# Patient Record
Sex: Female | Born: 1975 | Race: Black or African American | Hispanic: No | Marital: Single | Smoking: Never smoker
Health system: Southern US, Community
[De-identification: ages and names within clinical notes are randomized; demographics above are authoritative.]

## PROBLEM LIST (undated history)

## (undated) DIAGNOSIS — IMO0002 Reserved for concepts with insufficient information to code with codable children: Principal | ICD-10-CM

## (undated) DIAGNOSIS — Z87898 Personal history of other specified conditions: Secondary | ICD-10-CM

## (undated) DIAGNOSIS — R51 Headache: Secondary | ICD-10-CM

## (undated) DIAGNOSIS — B977 Papillomavirus as the cause of diseases classified elsewhere: Secondary | ICD-10-CM

## (undated) DIAGNOSIS — Z973 Presence of spectacles and contact lenses: Secondary | ICD-10-CM

## (undated) DIAGNOSIS — K219 Gastro-esophageal reflux disease without esophagitis: Secondary | ICD-10-CM

## (undated) DIAGNOSIS — J302 Other seasonal allergic rhinitis: Secondary | ICD-10-CM

## (undated) DIAGNOSIS — Z862 Personal history of diseases of the blood and blood-forming organs and certain disorders involving the immune mechanism: Secondary | ICD-10-CM

## (undated) DIAGNOSIS — Z87410 Personal history of cervical dysplasia: Secondary | ICD-10-CM

## (undated) DIAGNOSIS — D649 Anemia, unspecified: Secondary | ICD-10-CM

## (undated) DIAGNOSIS — I10 Essential (primary) hypertension: Secondary | ICD-10-CM

## (undated) DIAGNOSIS — L0592 Pilonidal sinus without abscess: Secondary | ICD-10-CM

## (undated) HISTORY — DX: Other seasonal allergic rhinitis: J30.2

## (undated) HISTORY — DX: Essential (primary) hypertension: I10

## (undated) HISTORY — PX: WISDOM TOOTH EXTRACTION: SHX21

## (undated) HISTORY — DX: Reserved for concepts with insufficient information to code with codable children: IMO0002

## (undated) HISTORY — DX: Papillomavirus as the cause of diseases classified elsewhere: B97.7

---

## 1996-11-06 HISTORY — PX: DILATION AND CURETTAGE OF UTERUS: SHX78

## 2001-04-10 ENCOUNTER — Other Ambulatory Visit: Admission: RE | Admit: 2001-04-10 | Discharge: 2001-04-10 | Payer: Self-pay | Admitting: Gynecology

## 2001-05-08 ENCOUNTER — Inpatient Hospital Stay (HOSPITAL_COMMUNITY): Admission: AD | Admit: 2001-05-08 | Discharge: 2001-05-08 | Payer: Self-pay | Admitting: Gynecology

## 2002-02-23 ENCOUNTER — Emergency Department (HOSPITAL_COMMUNITY): Admission: EM | Admit: 2002-02-23 | Discharge: 2002-02-24 | Payer: Self-pay

## 2002-04-16 ENCOUNTER — Emergency Department (HOSPITAL_COMMUNITY): Admission: EM | Admit: 2002-04-16 | Discharge: 2002-04-16 | Payer: Self-pay | Admitting: *Deleted

## 2002-04-30 ENCOUNTER — Other Ambulatory Visit: Admission: RE | Admit: 2002-04-30 | Discharge: 2002-04-30 | Payer: Self-pay | Admitting: Gynecology

## 2004-11-24 ENCOUNTER — Emergency Department (HOSPITAL_COMMUNITY): Admission: EM | Admit: 2004-11-24 | Discharge: 2004-11-24 | Payer: Self-pay | Admitting: Emergency Medicine

## 2006-03-07 ENCOUNTER — Inpatient Hospital Stay (HOSPITAL_COMMUNITY): Admission: AD | Admit: 2006-03-07 | Discharge: 2006-03-08 | Payer: Self-pay | Admitting: *Deleted

## 2006-07-15 IMAGING — US US PELVIS COMPLETE
1 series · 14 of 25 positions shown · non-contrast
Comparison: None

CLINICAL DATA: Heavy bleeding, not pregnant

TRANSABDOMINAL AND TRANSVAGINAL PELVIC ULTRASOUND
TECHNIQUE: Both transabdominal and transvaginal ultrasound examinations of the
pelvis were performed including evaluation of the uterus, ovaries, adnexal
regions, and pelvic cul-de-sac.

[Series 1: us pelvis complete · 0.37mm/px · 14 of 46 slices shown]
[im 1/46]
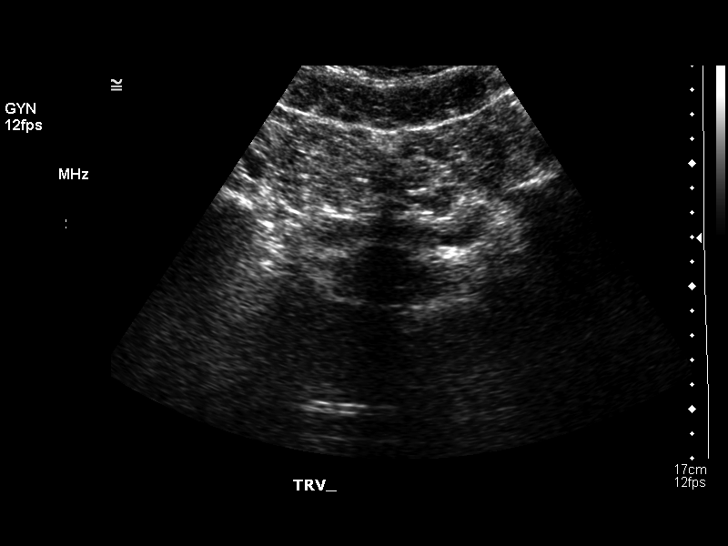
[im 4/46]
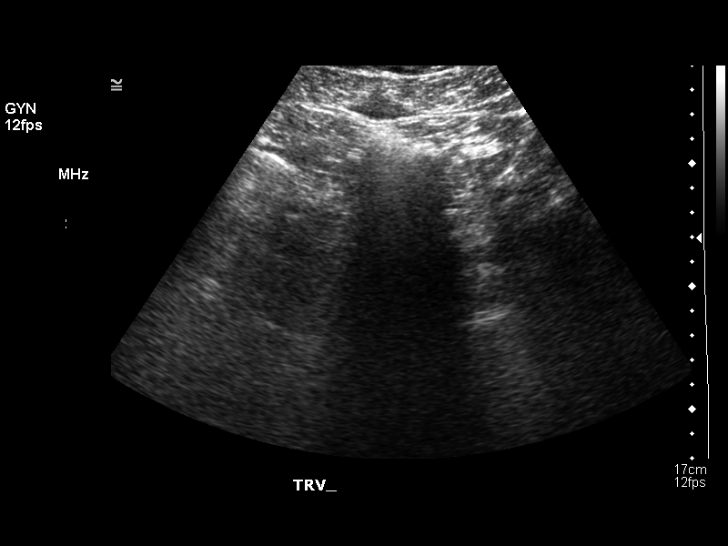
[im 8/46]
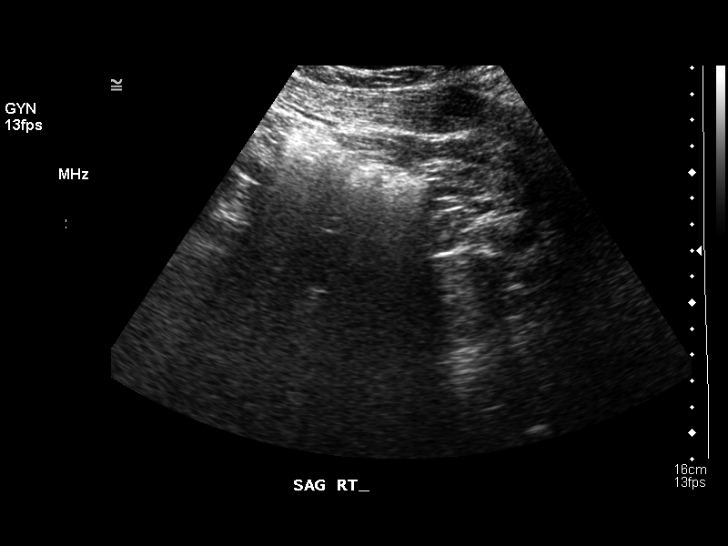
[im 12/46]
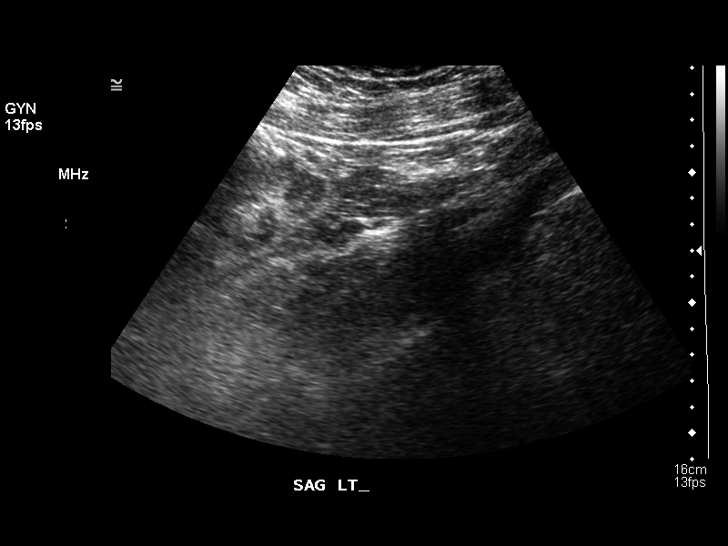
[im 16/46]
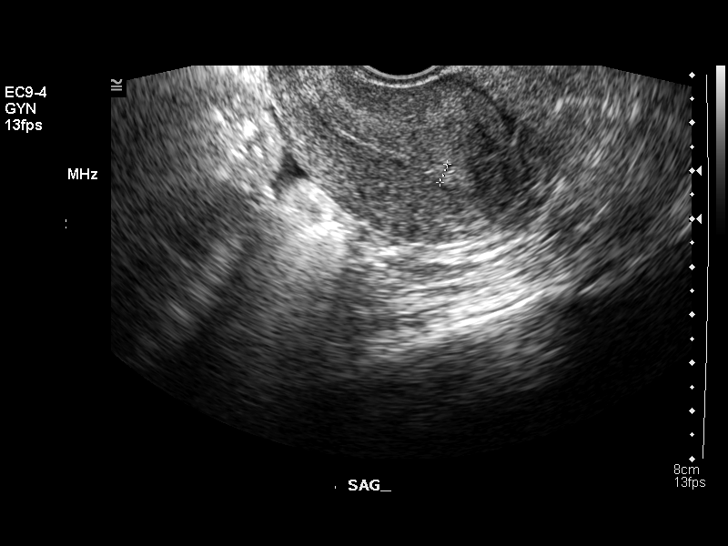
[im 17/46]
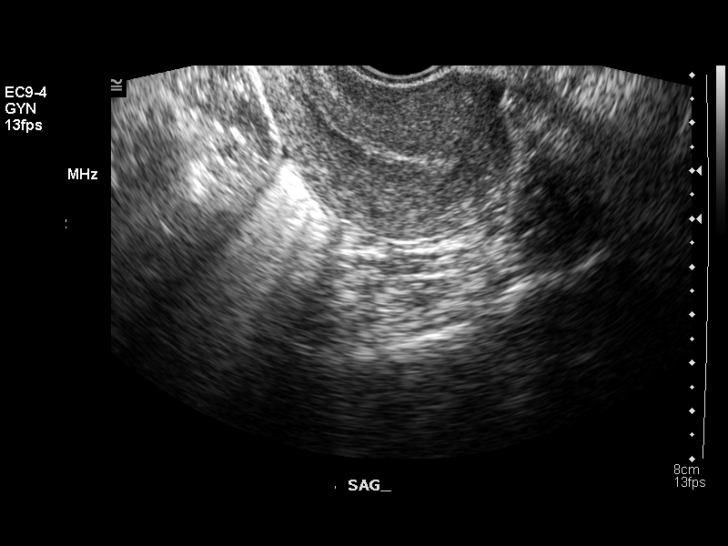
[im 21/46]
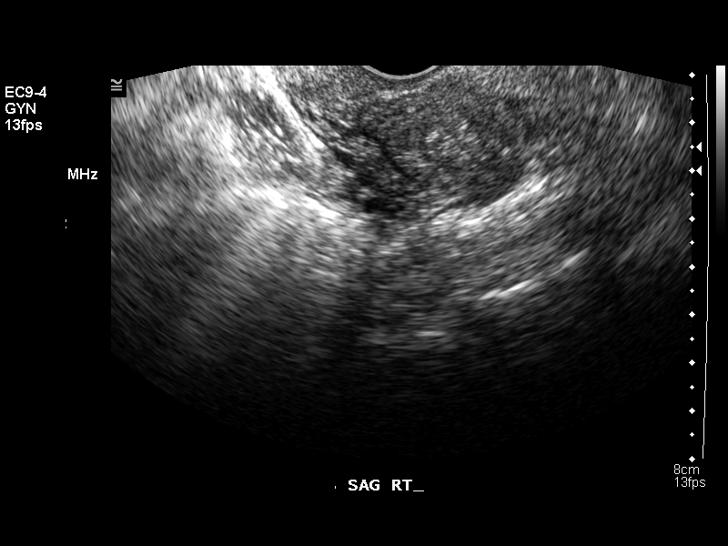
[im 25/46]
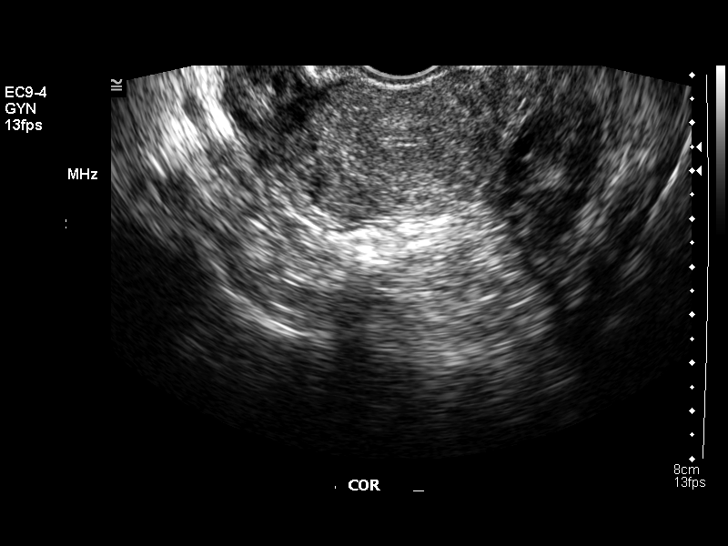
[im 29/46]
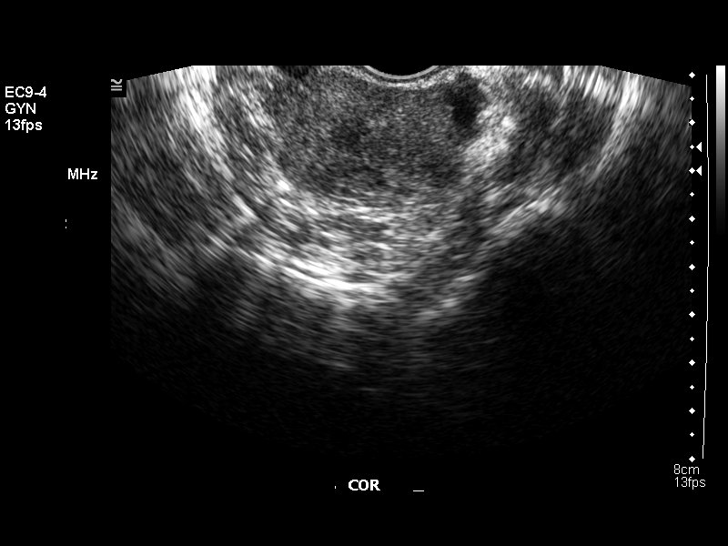
[im 31/46]
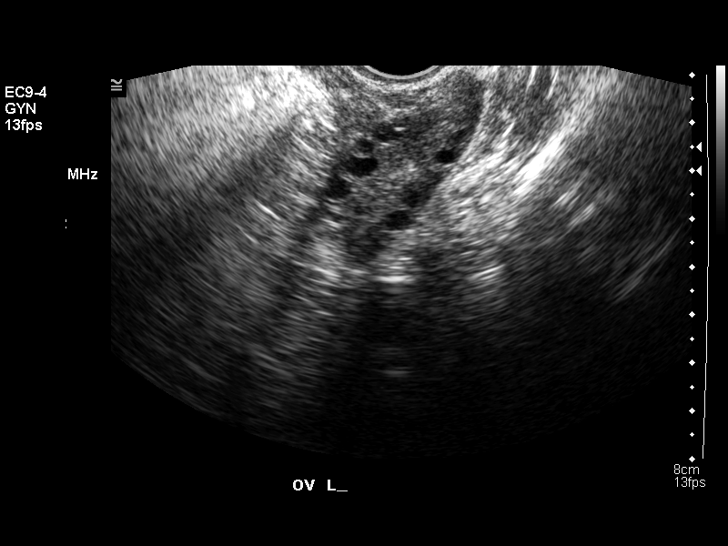
[im 34/46]
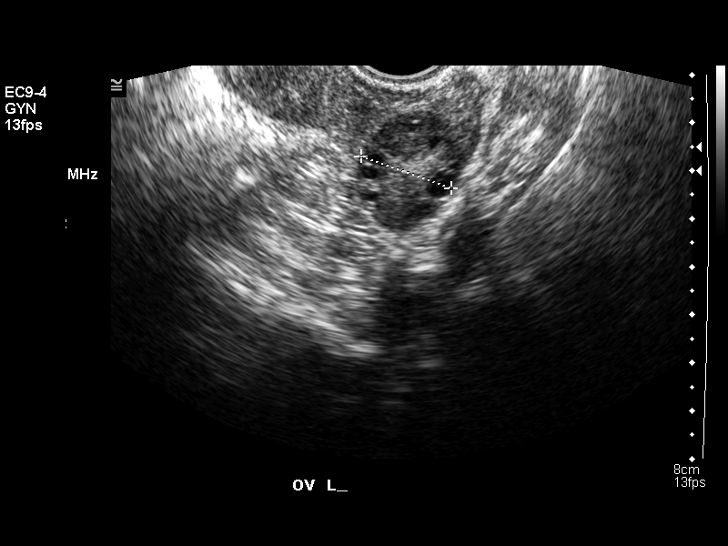
[im 38/46]
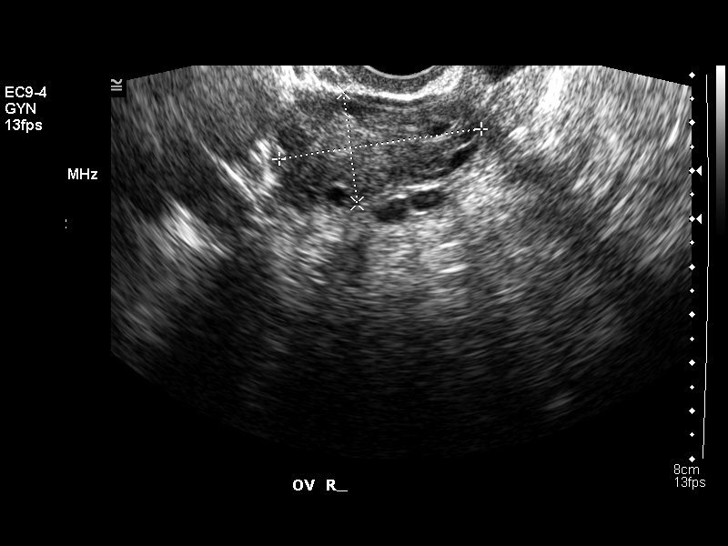
[im 42/46]
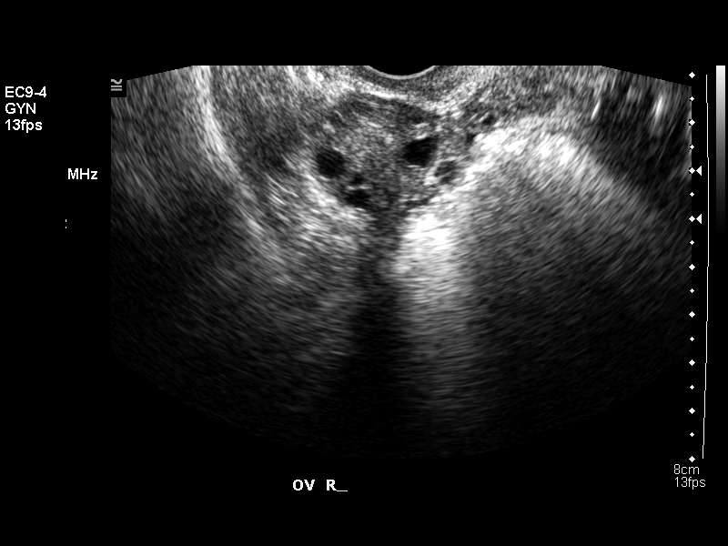
[im 46/46]
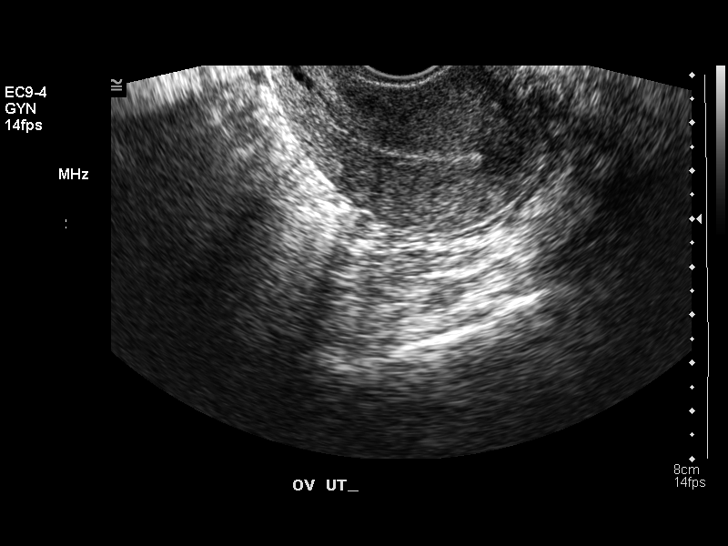

[14 of 25 positions shown; findings below may reference images not displayed]

FINDINGS: Uterus is 6.7 x 3.6 x 4.2 cm. Endometrial stripe is normal at 4 mm.
Small right paraovarian cyst at 1.2 cm. Both ovaries unremarkable. No free
fluid.

IMPRESSION

Small right paraovarian cyst. Otherwise unremarkable.

## 2008-10-13 ENCOUNTER — Emergency Department (HOSPITAL_COMMUNITY): Admission: EM | Admit: 2008-10-13 | Discharge: 2008-10-13 | Payer: Self-pay | Admitting: Emergency Medicine

## 2013-04-29 ENCOUNTER — Inpatient Hospital Stay (HOSPITAL_COMMUNITY)
Admission: AD | Admit: 2013-04-29 | Discharge: 2013-04-29 | Disposition: A | Discharge status: Home / Self Care | Payer: BC Managed Care – PPO | Source: Ambulatory Visit | Attending: Obstetrics & Gynecology | Admitting: Obstetrics & Gynecology

## 2013-04-29 ENCOUNTER — Encounter (HOSPITAL_COMMUNITY): Payer: Self-pay | Admitting: *Deleted

## 2013-04-29 ENCOUNTER — Inpatient Hospital Stay (HOSPITAL_COMMUNITY): Payer: BC Managed Care – PPO

## 2013-04-29 DIAGNOSIS — N83209 Unspecified ovarian cyst, unspecified side: Secondary | ICD-10-CM | POA: Insufficient documentation

## 2013-04-29 DIAGNOSIS — R109 Unspecified abdominal pain: Secondary | ICD-10-CM | POA: Insufficient documentation

## 2013-04-29 DIAGNOSIS — N920 Excessive and frequent menstruation with regular cycle: Secondary | ICD-10-CM | POA: Insufficient documentation

## 2013-04-29 DIAGNOSIS — N921 Excessive and frequent menstruation with irregular cycle: Secondary | ICD-10-CM

## 2013-04-29 LAB — CBC
Hemoglobin: 11.8 g/dL — ABNORMAL LOW (ref 12.0–15.0)
MCHC: 32.1 g/dL (ref 30.0–36.0)
MCV: 81.6 fL (ref 78.0–100.0)
Platelets: 350 10*3/uL (ref 150–400)
RDW: 15 % (ref 11.5–15.5)

## 2013-04-29 LAB — WET PREP, GENITAL: Trich, Wet Prep: NONE SEEN

## 2013-04-29 MED ORDER — MEGESTROL ACETATE 40 MG PO TABS: ORAL_TABLET | ORAL | Status: DC

## 2013-04-29 NOTE — MAU Provider Note (Signed)
Attestation of Attending Supervision of Advanced Practitioner (PA/CNM/NP): Evaluation and management procedures were performed by the Advanced Practitioner under my supervision and collaboration.  I have reviewed the Advanced Practitioner's note and chart, and I agree with the management and plan.  UGONNA  ANYANWU, MD, FACOG Attending Obstetrician & Gynecologist Faculty Practice, [facility]   

## 2013-04-29 NOTE — MAU Provider Note (Signed)
Chief Complaint: Vaginal Bleeding   First Provider Initiated Contact with Patient 04/29/13 1630     SUBJECTIVE HPI: Patricia Marks is a 37 y.o. G1P0 who presents to maternity admissions reporting vaginal bleeding x2 months.  [facility] and was given OCPs which reduced bleeding for 2 weeks but heavy bleeding resumed yesterday.  She has mild abdominal cramping with this bleeding but denies dizziness or fatigue.  She desires pregnancy and does not want to continue contraceptive treatments for bleeding.  She denies vaginal itching/burning, urinary symptoms, h/a, dizziness, n/v, or fever/chills.    Past Medical History  Diagnosis Date  . Medical history non-contributory    Past Surgical History  Procedure Laterality Date  . Dilation and curettage of uterus     History   Social History  . Marital Status: Single    Spouse Name: N/A    Number of Children: N/A  . Years of Education: N/A   Occupational History  . Not on file.   Social History Main Topics  . Smoking status: Not on file  . Smokeless tobacco: Not on file  . Alcohol Use: Not on file  . Drug Use: Not on file  . Sexually Active: Not on file   Other Topics Concern  . Not on file   Social History Narrative  . No narrative on file   No current facility-administered medications on file prior to encounter.   No current outpatient prescriptions on file prior to encounter.   No Known Allergies  ROS: Pertinent items in HPI  OBJECTIVE Blood pressure 137/79, pulse 99, temperature 98.3 F (36.8 C), temperature source Oral, resp. rate 16, last menstrual period 03/01/2013. GENERAL: Well-developed, well-nourished female in no acute distress.  HEENT: Normocephalic HEART: normal rate RESP: normal effort ABDOMEN: Soft, non-tender EXTREMITIES: Nontender, no edema NEURO: Alert and oriented Pelvic exam: Cervix pink, visually closed, without lesion, moderate amount dark red blood in vagina, vaginal walls and  external genitalia normal Bimanual exam: Cervix 0/long/high, firm, anterior, neg CMT, uterus nontender,with golf ball sized mass/tissue palpable in lower uterus/internal os of cervix, difficult to assess uterine size r/t body habitus, adnexa without tenderness, enlargement, or mass  LAB RESULTS Results for orders placed during the hospital encounter of 04/29/13 (from the past 24 hour(s))  CBC     Status: Abnormal   Collection Time    04/29/13  4:55 PM      Result Value Range   WBC 10.9 (*) 4.0 - 10.5 K/uL   RBC 4.51  3.87 - 5.11 MIL/uL   Hemoglobin 11.8 (*) 12.0 - 15.0 g/dL   HCT 11.9  14.7 - 82.9 %   MCV 81.6  78.0 - 100.0 fL   MCH 26.2  26.0 - 34.0 pg   MCHC 32.1  30.0 - 36.0 g/dL   RDW 56.2  13.0 - 86.5 %   Platelets 350  150 - 400 K/uL  WET PREP, GENITAL     Status: Abnormal   Collection Time    04/29/13  6:12 PM      Result Value Range   Yeast Wet Prep HPF POC NONE SEEN  NONE SEEN   Trich, Wet Prep NONE SEEN  NONE SEEN   Clue Cells Wet Prep HPF POC NONE SEEN  NONE SEEN   WBC, Wet Prep HPF POC FEW (*) NONE SEEN    IMAGING US Transvaginal Non-ob  04/29/2013   *RADIOLOGY REPORT*  Clinical Data: Vaginal bleeding for 2 months.  Palpable mass in  the lower uterine segment.  TRANSABDOMINAL AND TRANSVAGINAL ULTRASOUND OF PELVIS Technique:  Both transabdominal and transvaginal ultrasound examinations of the pelvis were performed. Transabdominal technique was performed for global imaging of the pelvis including uterus, ovaries, adnexal regions, and pelvic cul-de-sac.  It was necessary to proceed with endovaginal exam following the transabdominal exam to visualize the uterus, endometrium, ovaries, and adnexa.  Comparison:  Ultrasound dated 03/08/2006  Findings:  Uterus: Normal.  7.4 x 4.9 x 5.2 cm.  Endometrium: Heterogeneous with  increased vascularity.  4 mm in thickness.  Right ovary:  1.2 cm paraovarian cyst, unchanged since the prior exam of 03/08/2006.  Otherwise, normal.  3.5 x 1.7 x  2.5 cm.  Left ovary: Normal.  3.6 x 1.7 x 2.0 cm.  Other findings: No free fluid  IMPRESSION: Slightly heterogeneous endometrium.  Stable right paraovarian cyst.   Original Report Authenticated By: Francene Boyers, M.D.   US Pelvis Complete  04/29/2013   *RADIOLOGY REPORT*  Clinical Data: Vaginal bleeding for 2 months.  Palpable mass in the lower uterine segment.  TRANSABDOMINAL AND TRANSVAGINAL ULTRASOUND OF PELVIS Technique:  Both transabdominal and transvaginal ultrasound examinations of the pelvis were performed. Transabdominal technique was performed for global imaging of the pelvis including uterus, ovaries, adnexal regions, and pelvic cul-de-sac.  It was necessary to proceed with endovaginal exam following the transabdominal exam to visualize the uterus, endometrium, ovaries, and adnexa.  Comparison:  Ultrasound dated 03/08/2006  Findings:  Uterus: Normal.  7.4 x 4.9 x 5.2 cm.  Endometrium: Heterogeneous with  increased vascularity.  4 mm in thickness.  Right ovary:  1.2 cm paraovarian cyst, unchanged since the prior exam of 03/08/2006.  Otherwise, normal.  3.5 x 1.7 x 2.5 cm.  Left ovary: Normal.  3.6 x 1.7 x 2.0 cm.  Other findings: No free fluid  IMPRESSION: Slightly heterogeneous endometrium.  Stable right paraovarian cyst.   Original Report Authenticated By: Francene Boyers, M.D.    ASSESSMENT 1. Menometrorrhagia     PLAN Discharge home Megace taper x30 days F/U in Gyn clinic, discussed endometrial biopsy with pt, treatment options  Return to MAU as needed    Medication List    ASK your doctor about these medications       ibuprofen 800 MG tablet  Commonly known as:  ADVIL,MOTRIN  Take 800 mg by mouth every 8 (eight) hours as needed for pain (cramping).     MULTI-VITAMIN DAILY PO  Take 1 tablet by mouth daily.     RECLIPSEN 0.15-30 MG-MCG tablet  Generic drug:  desogestrel-ethinyl estradiol  Take 1 tablet by mouth daily.         Sharen Counter Certified  Nurse-Midwife 04/29/2013  6:13 PM

## 2013-04-29 NOTE — MAU Note (Signed)
Pt states she has been bleeding since 03/01/2013. Pt states she is still bleeding after seeing the campus MD and has taking birth control pills 2 x 3 days then 1 daily since then

## 2013-05-12 ENCOUNTER — Telehealth: Payer: Self-pay | Admitting: Advanced Practice Midwife

## 2013-05-12 NOTE — Telephone Encounter (Signed)
Pt called to report she is still bleeding despite Megace and wants to know if D&C is an option.   Pt has appointment in Gyn clinic in 1 week.  Refill of Megace prescribed to begin taper again.  F/U at Gyn visit for further management.  Pt desires fertility and does not want contraception at this time.

## 2013-05-19 ENCOUNTER — Encounter: Payer: Self-pay | Admitting: Obstetrics and Gynecology

## 2013-05-19 ENCOUNTER — Ambulatory Visit (INDEPENDENT_AMBULATORY_CARE_PROVIDER_SITE_OTHER): Payer: BC Managed Care – PPO | Admitting: Obstetrics & Gynecology

## 2013-05-19 ENCOUNTER — Other Ambulatory Visit (HOSPITAL_COMMUNITY)
Admission: RE | Admit: 2013-05-19 | Discharge: 2013-05-19 | Disposition: A | Discharge status: Home / Self Care | Payer: BC Managed Care – PPO | Source: Ambulatory Visit | Attending: Obstetrics & Gynecology | Admitting: Obstetrics & Gynecology

## 2013-05-19 VITALS — BP 148/88 | HR 84 | Ht 65.75 in | Wt 250.7 lb

## 2013-05-19 DIAGNOSIS — N939 Abnormal uterine and vaginal bleeding, unspecified: Secondary | ICD-10-CM

## 2013-05-19 DIAGNOSIS — N926 Irregular menstruation, unspecified: Secondary | ICD-10-CM

## 2013-05-19 LAB — POCT PREGNANCY, URINE: Preg Test, Ur: NEGATIVE

## 2013-05-19 NOTE — Progress Notes (Signed)
GYNECOLOGY CLINIC PROGRESS NOTE  History:  37 y.o. G1P0010 here today for endometrial biopsy and management of AUB not managed with OCPs or Megace taper. Has been bleeding  On most days since 02/24/13.  Patient is frustrated.    The following portions of the patient's history were reviewed and updated as appropriate: allergies, current medications, past family history, past medical history, past social history, past surgical history and problem list.  Review of Systems:  Pertinent items are noted in HPI.  Objective:  Physical Exam BP 148/88  Pulse 84  Ht 5' 5.75" (1.67 m)  Wt 250 lb 11.2 oz (113.717 kg)  BMI 40.77 kg/m2  LMP 03/01/2013 Gen: NAD Abd: Soft, obese, nontender and nondistended Pelvic: Normal appearing external genitalia, small amount of bright red blood noted  ENDOMETRIAL BIOPSY     The indications for endometrial biopsy were reviewed.   Risks of the biopsy including cramping, bleeding, infection, uterine perforation, inadequate specimen and need for additional procedures  were discussed. The patient states she understands and agrees to undergo procedure today. Consent was signed. Time out was performed. Urine HCG was negative. A sterile speculum was placed in the patient's vagina and the cervix was prepped with Betadine. The 3 mm pipelle was introduced into the endometrial cavity without difficulty to a depth of 7 cm, and a moderate amount of tissue was obtained and sent to pathology. The instruments were removed from the patient's vagina. Minimal bleeding from the cervix was noted. The patient tolerated the procedure well. Routine post-procedure instructions were given to the patient  Labs and Imaging 04/29/2013   TRANSABDOMINAL AND TRANSVAGINAL ULTRASOUND OF PELVIS  Clinical Data: Vaginal bleeding for 2 months.  Palpable mass in the lower uterine segment.  Technique:  Both transabdominal and transvaginal ultrasound examinations of the pelvis were performed. Transabdominal  technique was performed for global imaging of the pelvis including uterus, ovaries, adnexal regions, and pelvic cul-de-sac.  It was necessary to proceed with endovaginal exam following the transabdominal exam to visualize the uterus, endometrium, ovaries, and adnexa.  Comparison:  Ultrasound dated 03/08/2006  Findings:  Uterus: Normal.  7.4 x 4.9 x 5.2 cm.  Endometrium: Heterogeneous with  increased vascularity.  6.2 mm in thickness.  Right ovary:  1.2 cm paraovarian cyst, unchanged since the prior exam of 03/08/2006.  Otherwise, normal.  3.5 x 1.7 x 2.5 cm.  Left ovary: Normal.  3.6 x 1.7 x 2.0 cm.  Other findings: No free fluid  IMPRESSION: Slightly heterogeneous endometrium.  Stable right paraovarian cyst.   Original Report Authenticated By: Francene Boyers, M.D.   Assessment & Plan:   Will follow up endometrial biopsy. Patient desires future childbearing.  Discussed management options for abnormal uterine bleeding including oral progesterone, Depo Provera, Mirena IUD.  Discussed risks and benefits of each method.  Patient will think about this options.  Printed patient education handouts were given to the patient to review at home.  Bleeding precautions reviewed; she was told to try Megace 120 mg po tid for seven days then taper down as instructed.

## 2013-05-19 NOTE — Patient Instructions (Signed)
Dysfunctional Uterine Bleeding Normally, menstrual periods begin between ages 11 to 17 in young women. A normal menstrual cycle/period may begin every 23 days up to 35 days and lasts from 1 to 7 days. Around 12 to 14 days before your menstrual period starts, ovulation (ovary produces an egg) occurs. When counting the time between menstrual periods, count from the first day of bleeding of the previous period to the first day of bleeding of the next period. Dysfunctional (abnormal) uterine bleeding is bleeding that is different from a normal menstrual period. Your periods may come earlier or later than usual. They may be lighter, have blood clots or be heavier. You may have bleeding between periods, or you may skip one period or more. You may have bleeding after sexual intercourse, bleeding after menopause, or no menstrual period. CAUSES   Pregnancy (normal, miscarriage, tubal).  IUDs (intrauterine device, birth control).  Birth control pills.  Hormone treatment.  Menopause.  Infection of the cervix.  Blood clotting problems.  Infection of the inside lining of the uterus.  Endometriosis, inside lining of the uterus growing in the pelvis and other female organs.  Adhesions (scar tissue) inside the uterus.  Obesity or severe weight loss.  Uterine polyps inside the uterus.  Cancer of the vagina, cervix, or uterus.  Ovarian cysts or polycystic ovary syndrome.  Medical problems (diabetes, thyroid disease).  Uterine fibroids (noncancerous tumor).  Problems with your female hormones.  Endometrial hyperplasia, very thick lining and enlarged cells inside of the uterus.  Medicines that interfere with ovulation.  Radiation to the pelvis or abdomen.  Chemotherapy. DIAGNOSIS   Your doctor will discuss the history of your menstrual periods, medicines you are taking, changes in your weight, stress in your life, and any medical problems you may have.  Your doctor will do a physical  and pelvic examination.  Your doctor may want to perform certain tests to make a diagnosis, such as:  Pap test.  Blood tests.  Cultures for infection.  CT scan.  Ultrasound.  Hysteroscopy.  Laparoscopy.  MRI.  Hysterosalpingography.  D and C.  Endometrial biopsy. TREATMENT  Treatment will depend on the cause of the dysfunctional uterine bleeding (DUB). Treatment may include:  Observing your menstrual periods for a couple of months.  Prescribing medicines for medical problems, including:  Antibiotics.  Hormones.  Birth control pills.  Removing an IUD (intrauterine device, birth control).  Surgery:  D and C (scrape and remove tissue from inside the uterus).  Laparoscopy (examine inside the abdomen with a lighted tube).  Uterine ablation (destroy lining of the uterus with electrical current, laser, heat, or freezing).  Hysteroscopy (examine cervix and uterus with a lighted tube).  Hysterectomy (remove the uterus). HOME CARE INSTRUCTIONS   If medicines were prescribed, take exactly as directed. Do not change or switch medicines without consulting your caregiver.  Long term heavy bleeding may result in iron deficiency. Your caregiver may have prescribed iron pills. They help replace the iron that your body lost from heavy bleeding. Take exactly as directed.  Do not take aspirin or medicines that contain aspirin one week before or during your menstrual period. Aspirin may make the bleeding worse.  If you need to change your sanitary pad or tampon more than once every 2 hours, stay in bed with your feet elevated and a cold pack on your lower abdomen. Rest as much as possible, until the bleeding stops or slows down.  Eat well-balanced meals. Eat foods high in iron. Examples   are:  Leafy green vegetables.  Whole-grain breads and cereals.  Eggs.  Meat.  Liver.  Do not try to lose weight until the abnormal bleeding has stopped and your blood iron level is  back to normal. Do not lift more than ten pounds or do strenuous activities when you are bleeding.  For a couple of months, make note on your calendar, marking the start and ending of your period, and the type of bleeding (light, medium, heavy, spotting, clots or missed periods). This is for your caregiver to better evaluate your problem. SEEK MEDICAL CARE IF:   You develop nausea (feeling sick to your stomach) and vomiting, dizziness, or diarrhea while you are taking your medicine.  You are getting lightheaded or weak.  You have any problems that may be related to the medicine you are taking.  You develop pain with your DUB.  You want to remove your IUD.  You want to stop or change your birth control pills or hormones.  You have any type of abnormal bleeding mentioned above.  You are over 45 years old and have not had a menstrual period yet.  You are 37 years old and you are still having menstrual periods.  You have any of the symptoms mentioned above.  You develop a rash. SEEK IMMEDIATE MEDICAL CARE IF:   An oral temperature above 102 F (38.9 C) develops.  You develop chills.  You are changing your sanitary pad or tampon more than once an hour.  You develop abdominal pain.  You pass out or faint. Document Released: 10/20/2000 Document Revised: 01/15/2012 Document Reviewed: 09/21/2009 [facility] Patient Information 2014 [place], [place].  Abnormal Uterine Bleeding Abnormal uterine bleeding can have many causes. Some cases are simply treated, while others are more serious. There are several kinds of bleeding that is considered abnormal, including:  Bleeding between periods.  Bleeding after sexual intercourse.  Spotting anytime in the menstrual cycle.  Bleeding heavier or more than normal.  Bleeding after menopause. CAUSES  There are many causes of abnormal uterine bleeding. It can be present in teenagers, pregnant women, women during their reproductive years, and  women who have reached menopause. Your caregiver will look for the more common causes depending on your age, signs, symptoms and your particular circumstance. Most cases are not serious and can be treated. Even the more serious causes, like cancer of the female organs, can be treated adequately if found in the early stages. That is why all types of bleeding should be evaluated and treated as soon as possible. DIAGNOSIS  Diagnosing the cause may take several kinds of tests. Your caregiver may:  Take a complete history of the type of bleeding.  Perform a complete physical exam and Pap smear.  Take an ultrasound on the abdomen showing a picture of the female organs and the pelvis.  Inject dye into the uterus and Fallopian tubes and X-ray them (hysterosalpingogram).  Place fluid in the uterus and do an ultrasound (sonohysterogrqphy).  Take a CT scan to examine the female organs and pelvis.  Take an MRI to examine the female organs and pelvis. There is no X-ray involved with this procedure.  Look inside the uterus with a telescope that has a light at the end (hysteroscopy).  Scrap the inside of the uterus to get tissue to examine (Dilatation and Curettage, D&C).  Look into the pelvis with a telescope that has a light at the end (laparoscopy). This is done through a very small cut (incision) in the  abdomen. TREATMENT  Treatment will depend on the cause of the abnormal bleeding. It can include:  Doing nothing to allow the problem to take care of itself over time.  Hormone treatment.  Birth control pills.  Treating the medical condition causing the problem.  Laparoscopy.  Major or minor surgery  Destroying the lining of the uterus with electrical currant, laser, freezing or heat (uterine ablation). HOME CARE INSTRUCTIONS   Follow your caregiver's recommendation on how to treat your problem.  See your caregiver if you missed a menstrual period and think you may be pregnant.  If  you are bleeding heavily, count the number of pads/tampons you use and how often you have to change them. Tell this to your caregiver.  Avoid sexual intercourse until the problem is controlled. SEEK MEDICAL CARE IF:   You have any kind of abnormal bleeding mentioned above.  You feel dizzy at times.  You are 37 years old and have not had a menstrual period yet. SEEK IMMEDIATE MEDICAL CARE IF:   You pass out.  You are changing pads/tampons every 15 to 30 minutes.  You have belly (abdominal) pain.  You have a temperature of 100 F (37.8 C) or higher.  You become sweaty or weak.  You are passing large blood clots from the vagina.  You start to feel sick to your stomach (nauseous) and throw up (vomit). Document Released: 10/23/2005 Document Revised: 01/15/2012 Document Reviewed: 03/18/2009 [facility] Patient Information 2014 [place], [place].

## 2013-05-21 ENCOUNTER — Encounter: Payer: Self-pay | Admitting: Obstetrics & Gynecology

## 2013-06-06 ENCOUNTER — Encounter: Payer: Self-pay | Admitting: *Deleted

## 2013-09-05 IMAGING — US US TRANSVAGINAL NON-OB
1 series · 14 of 25 positions shown · non-contrast
Comparison: Ultrasound dated 03/08/2006

CLINICAL DATA: Vaginal bleeding for 2 months.  Palpable mass in the
lower uterine segment.

TRANSABDOMINAL AND TRANSVAGINAL ULTRASOUND OF PELVIS
TECHNIQUE: Both transabdominal and transvaginal ultrasound
examinations of the pelvis were performed. Transabdominal technique
was performed for global imaging of the pelvis including uterus,
ovaries, adnexal regions, and pelvic cul-de-sac.
It was necessary to proceed with endovaginal exam following the
transabdominal exam to visualize the uterus, endometrium, ovaries,
and adnexa..

[Series 1: us pelvis complete · 14 of 104 slices shown]
[im 1/104]
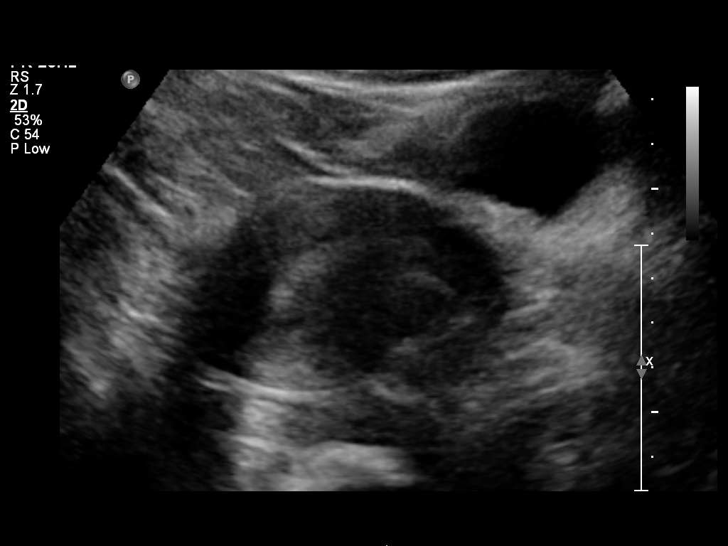
[im 9/104]
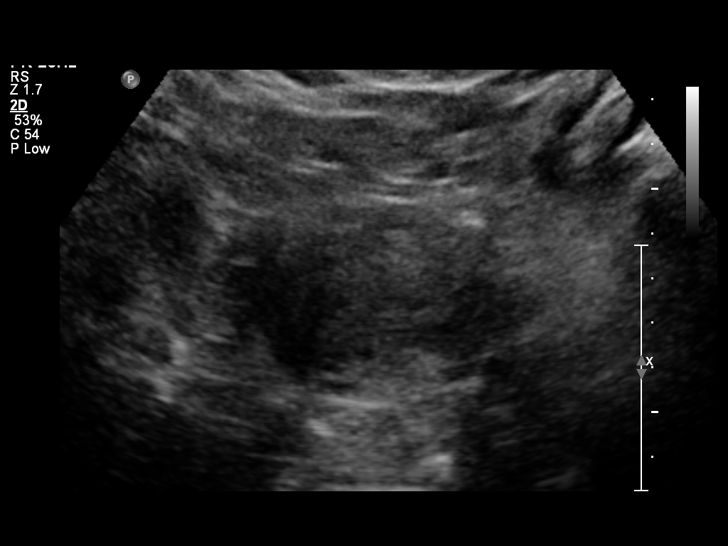
[im 18/104]
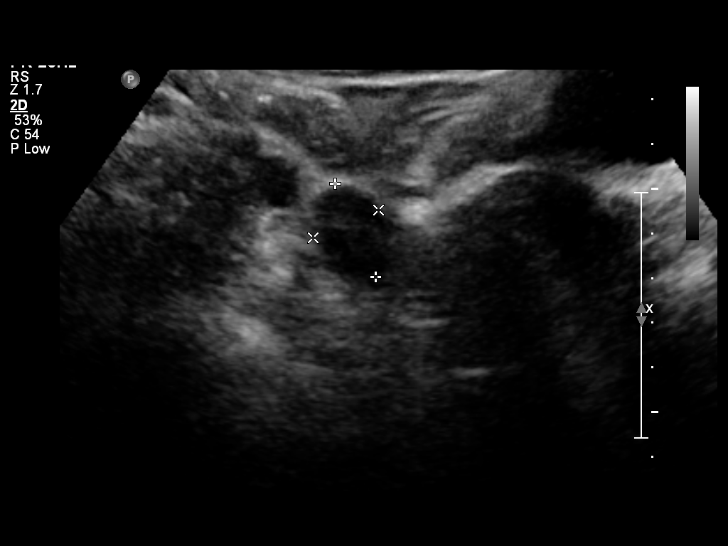
[im 26/104]
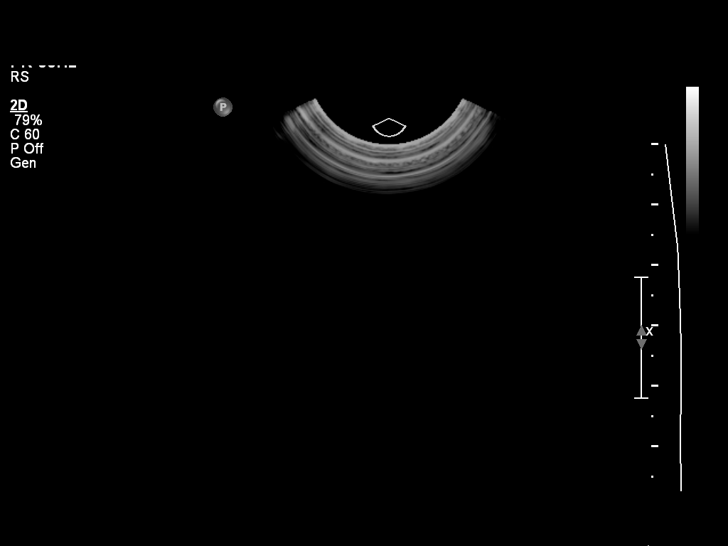
[im 35/104]
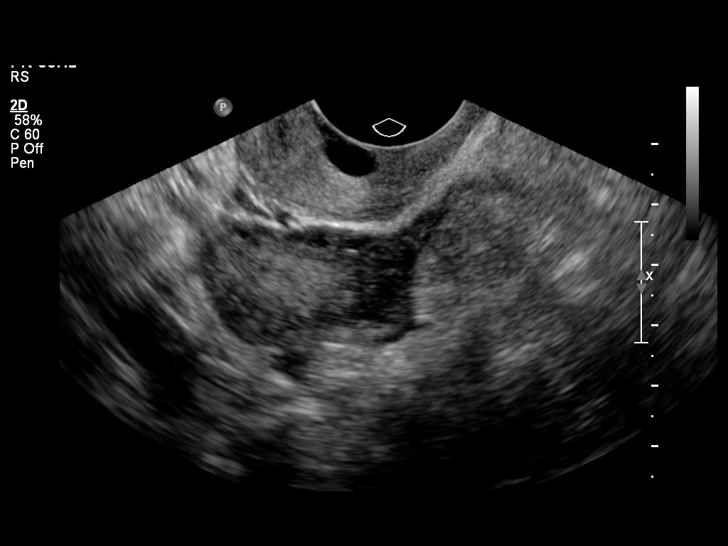
[im 39/104]
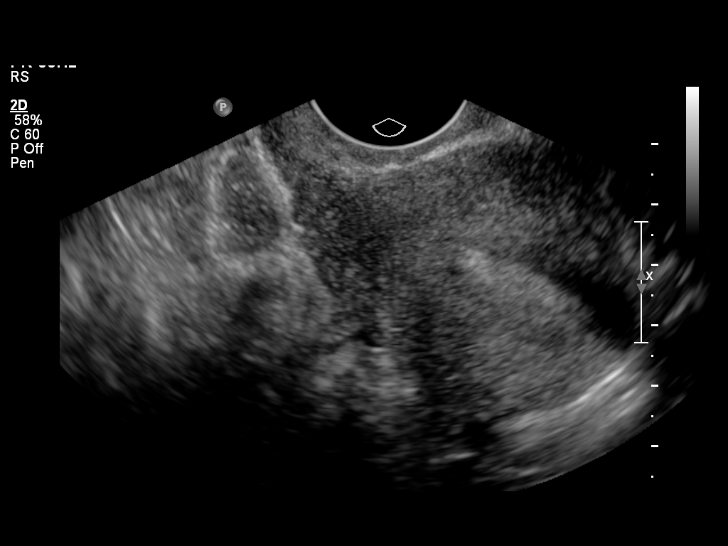
[im 48/104]
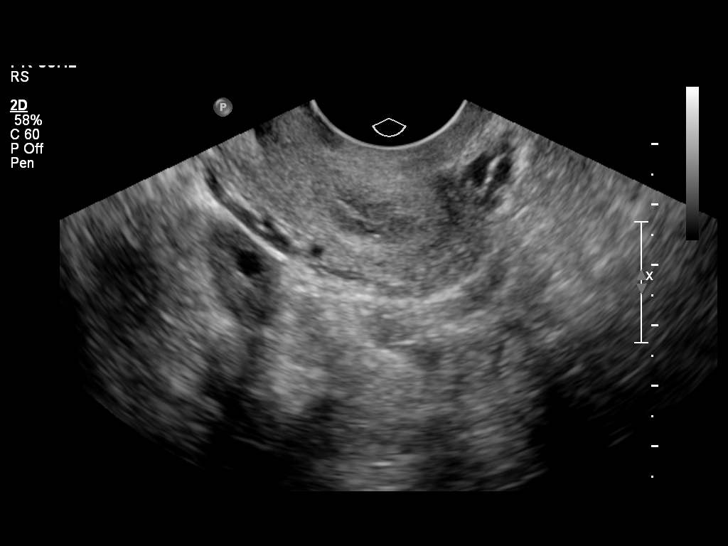
[im 56/104]
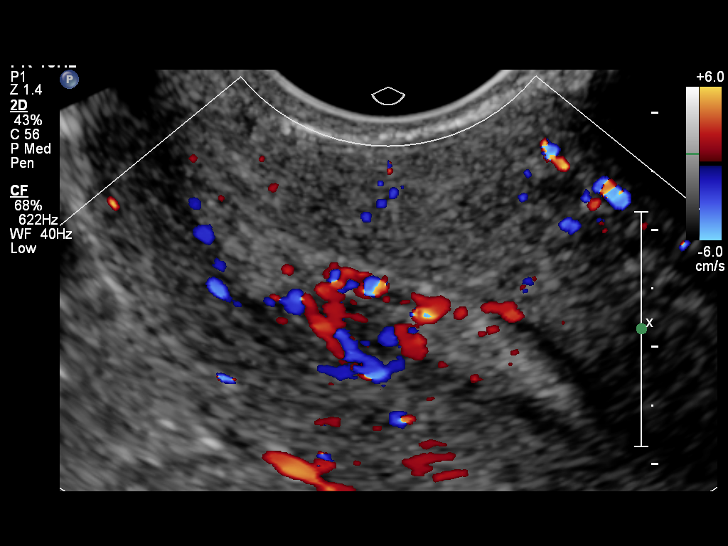
[im 65/104]
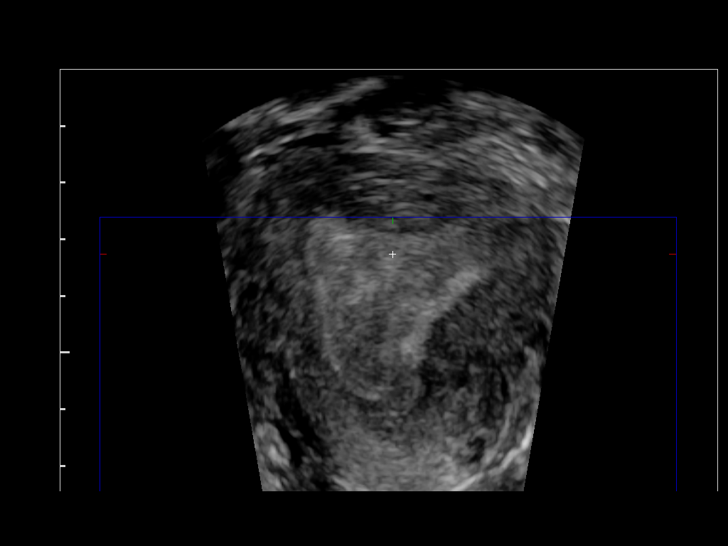
[im 69/104]
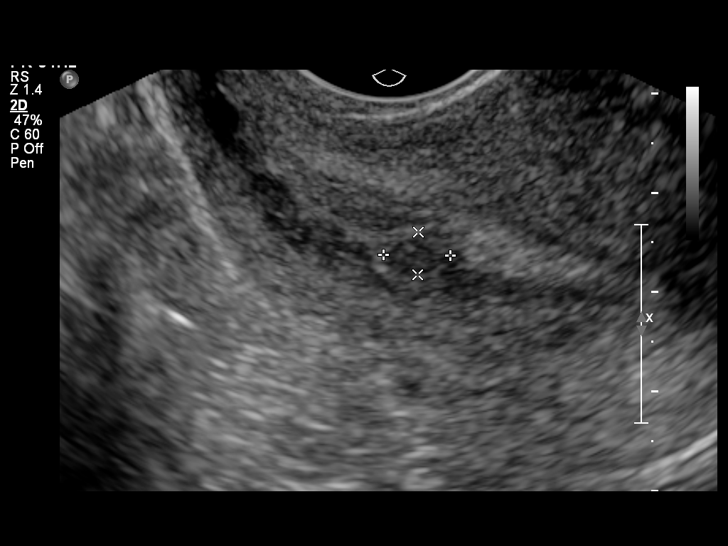
[im 78/104]
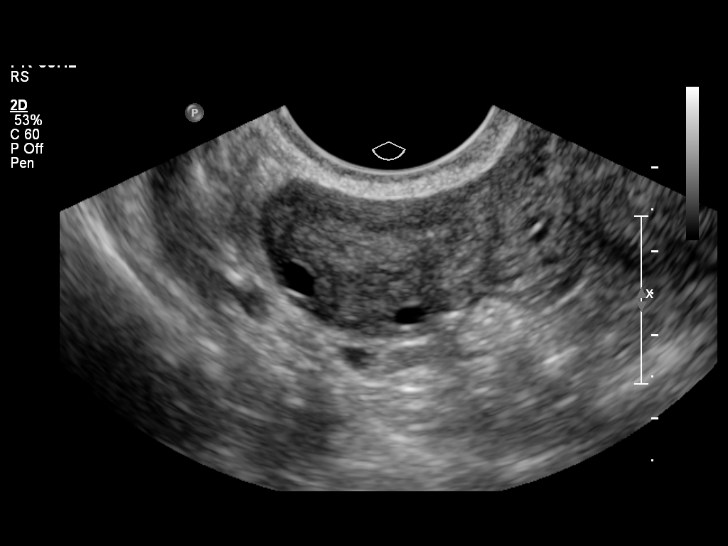
[im 86/104]
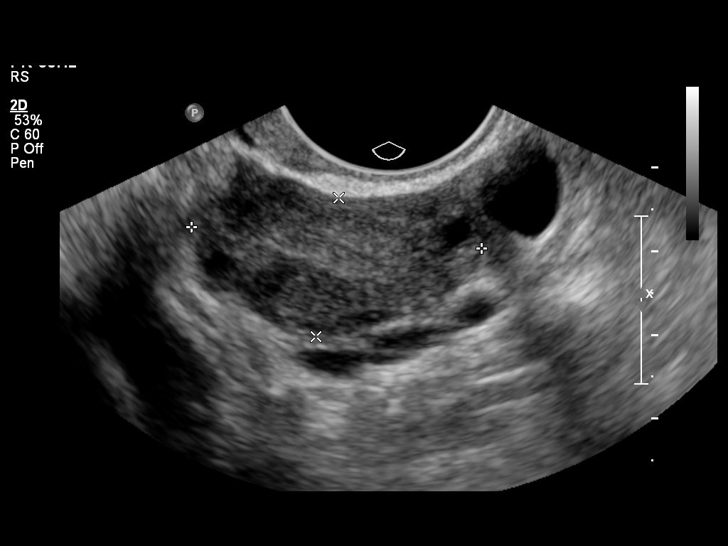
[im 95/104]
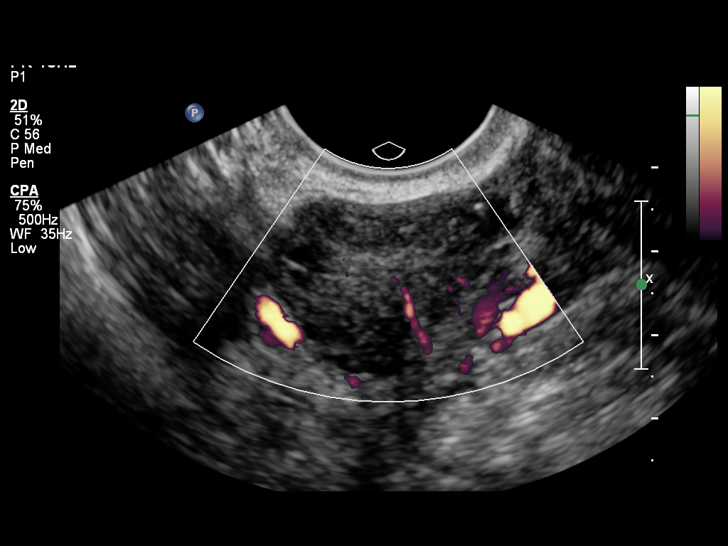
[im 104/104]
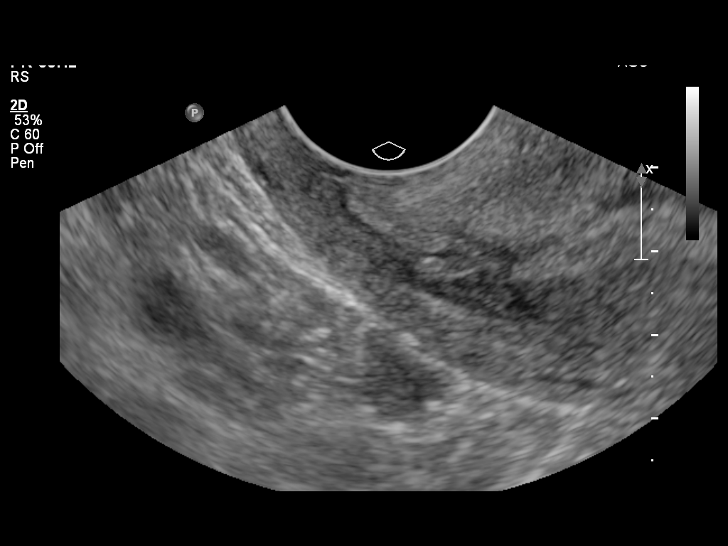

[14 of 25 positions shown; findings below may reference images not displayed]

FINDINGS: Uterus: Normal.  7.4 x 4.9 x 5.2 cm.

Endometrium: Heterogeneous with  increased vascularity.  4 mm in
thickness.

Right ovary:  1.2 cm paraovarian cyst, unchanged since the prior
exam of 03/08/2006.  Otherwise, normal.  3.5 x 1.7 x 2.5 cm.

Left ovary: Normal.  3.6 x 1.7 x 2.0 cm.

Other findings: No free fluid
IMPRESSION: Slightly heterogeneous endometrium.  Stable right paraovarian cyst.

## 2013-09-11 ENCOUNTER — Other Ambulatory Visit: Payer: Self-pay

## 2013-09-16 ENCOUNTER — Encounter: Payer: Self-pay | Admitting: Women's Health

## 2013-09-16 ENCOUNTER — Ambulatory Visit (INDEPENDENT_AMBULATORY_CARE_PROVIDER_SITE_OTHER): Payer: BC Managed Care – PPO | Admitting: Women's Health

## 2013-09-16 VITALS — BP 150/98 | Ht 65.5 in | Wt 261.0 lb

## 2013-09-16 DIAGNOSIS — N938 Other specified abnormal uterine and vaginal bleeding: Secondary | ICD-10-CM

## 2013-09-16 DIAGNOSIS — N949 Unspecified condition associated with female genital organs and menstrual cycle: Secondary | ICD-10-CM

## 2013-09-16 MED ORDER — MEGESTROL ACETATE 40 MG PO TABS: ORAL_TABLET | ORAL | Status: DC

## 2013-09-16 NOTE — Patient Instructions (Signed)
Dysfunctional Uterine Bleeding Normally, menstrual periods begin between ages 11 to 17 in [place] Marks women. A normal menstrual cycle/period may begin every 23 days up to 35 days and lasts from 1 to 7 days. Around 12 to 14 days before your menstrual period starts, ovulation (ovary produces an egg) occurs. When counting the time between menstrual periods, count from the first day of bleeding of the previous period to the first day of bleeding of the next period. Dysfunctional (abnormal) uterine bleeding is bleeding that is different from a normal menstrual period. Your periods may come earlier or later than usual. They may be lighter, have blood clots or be heavier. You may have bleeding between periods, or you may skip one period or more. You may have bleeding after sexual intercourse, bleeding after menopause, or no menstrual period. CAUSES   Pregnancy (normal, miscarriage, tubal).  IUDs (intrauterine device, birth control).  Birth control pills.  Hormone treatment.  Menopause.  Infection of the cervix.  Blood clotting problems.  Infection of the inside lining of the uterus.  Endometriosis, inside lining of the uterus growing in the pelvis and other female organs.  Adhesions (scar tissue) inside the uterus.  Obesity or severe weight loss.  Uterine polyps inside the uterus.  Cancer of the vagina, cervix, or uterus.  Ovarian cysts or polycystic ovary syndrome.  Medical problems (diabetes, thyroid disease).  Uterine fibroids (noncancerous tumor).  Problems with your female hormones.  Endometrial hyperplasia, very thick lining and enlarged cells inside of the uterus.  Medicines that interfere with ovulation.  Radiation to the pelvis or abdomen.  Chemotherapy. DIAGNOSIS   Your doctor will discuss the history of your menstrual periods, medicines you are taking, changes in your weight, stress in your life, and any medical problems you may have.  Your doctor will do a physical  and pelvic examination.  Your doctor may want to perform certain tests to make a diagnosis, such as:  Pap test.  Blood tests.  Cultures for infection.  CT scan.  Ultrasound.  Hysteroscopy.  Laparoscopy.  MRI.  Hysterosalpingography.  D and C.  Endometrial biopsy. TREATMENT  Treatment will depend on the cause of the dysfunctional uterine bleeding (DUB). Treatment may include:  Observing your menstrual periods for a couple of months.  Prescribing medicines for medical problems, including:  Antibiotics.  Hormones.  Birth control pills.  Removing an IUD (intrauterine device, birth control).  Surgery:  D and C (scrape and remove tissue from inside the uterus).  Laparoscopy (examine inside the abdomen with a lighted tube).  Uterine ablation (destroy lining of the uterus with electrical current, laser, heat, or freezing).  Hysteroscopy (examine cervix and uterus with a lighted tube).  Hysterectomy (remove the uterus). HOME CARE INSTRUCTIONS   If medicines were prescribed, take exactly as directed. Do not change or switch medicines without consulting your caregiver.  Long term heavy bleeding may result in iron deficiency. Your caregiver may have prescribed iron pills. They help replace the iron that your body lost from heavy bleeding. Take exactly as directed.  Do not take aspirin or medicines that contain aspirin one week before or during your menstrual period. Aspirin may make the bleeding worse.  If you need to change your sanitary pad or tampon more than once every 2 hours, stay in bed with your feet elevated and a cold pack on your lower abdomen. Rest as much as possible, until the bleeding stops or slows down.  Eat well-balanced meals. Eat foods high in iron. Examples   are:  Leafy green vegetables.  Whole-grain breads and cereals.  Eggs.  Meat.  Liver.  Do not try to lose weight until the abnormal bleeding has stopped and your blood iron level is  back to normal. Do not lift more than ten pounds or do strenuous activities when you are bleeding.  For a couple of months, make note on your calendar, marking the start and ending of your period, and the type of bleeding (light, medium, heavy, spotting, clots or missed periods). This is for your caregiver to better evaluate your problem. SEEK MEDICAL CARE IF:   You develop nausea (feeling sick to your stomach) and vomiting, dizziness, or diarrhea while you are taking your medicine.  You are getting lightheaded or weak.  You have any problems that may be related to the medicine you are taking.  You develop pain with your DUB.  You want to remove your IUD.  You want to stop or change your birth control pills or hormones.  You have any type of abnormal bleeding mentioned above.  You are over 16 years old and have not had a menstrual period yet.  You are 37 years old and you are still having menstrual periods.  You have any of the symptoms mentioned above.  You develop a rash. SEEK IMMEDIATE MEDICAL CARE IF:   An oral temperature above 102 F (38.9 C) develops.  You develop chills.  You are changing your sanitary pad or tampon more than once an hour.  You develop abdominal pain.  You pass out or faint. Document Released: 10/20/2000 Document Revised: 01/15/2012 Document Reviewed: 09/21/2009 [employer] Patient Information 2014 [employer].  

## 2013-09-16 NOTE — Progress Notes (Signed)
Patricia Marks Jan 14, 1976 [id number]    History:    Presents as a new patient with a problem from [facility] student health. History of mostly monthly cycles for 6-7 days until April 2014. Cycles became more frequent and heavier lasting 10-14 days. Was seen at student health 09/08/2013 had a normal TSH, negative hCG, negative STD screen. 05/2013 was seen at [facility] hospital for DUB/menorrhagia, negative endometrial biopsy. Currently taking Megace monthly since July to stop cycles. Would like to regulate cycles but wishes to preserve fertility. Also having blood pressure issues, blood pressure ranging between 141/86 to 160/97. Has not had a Pap smear due to menorrhagia this year. Last normal Pap 2011.   Exam:  Filed Vitals:   09/16/13 1518  BP: 150/98    General appearance:  Normal Head/Neck:  Normal, without cervical or supraclavicular adenopathy. Thyroid:  Symmetrical, normal in size, without palpable masses or nodularity. Respiratory  Effort:  Normal  Auscultation:  Clear without wheezing or rhonchi Cardiovascular  Auscultation:  Regular rate, without rubs, murmurs or gallops  Edema/varicosities:  Not grossly evident Abdominal  Soft,nontender, without masses, guarding or rebound.  Liver/spleen:  No organomegaly noted  Hernia:  None appreciated  Skin  Inspection:  Grossly normal  Palpation:  Grossly normal Neurologic/psychiatric  Orientation:  Normal with appropriate conversation.  Mood/affect:  Normal  Genitourinary    Breasts: Examined lying and sitting.     Right: Without masses, retractions, discharge or axillary adenopathy.     Left: Without masses, retractions, discharge or axillary adenopathy.   Inguinal/mons:  Normal without inguinal adenopathy  External genitalia:  Normal  BUS/Urethra/Skene's glands:  Normal  Bladder:  Normal  Vagina:  Normal  Cervix:  Normal, scant menses  Uterus:   normal in size, shape and contour.  Midline and mobile  Adnexa/parametria:      Rt: Without masses or tenderness.   Lt: Without masses or tenderness.  Anus and perineum: Normal  Digital rectal exam: Normal sphincter tone without palpated masses or tenderness  Assessment/Plan:  37 y.o. SBF G0 for problem visit.  DUB/menorrhagia Morbid obesity Elevated blood pressure  Plan: Schedule sonohysterogram with Dr. Audie Box. Abstain until, Megace twice daily if bleeding would start prior to sonohysterogram. Mirena IUD information reviewed would prefer not to use. Schedule appointment with [facility] for pap and blood pressure management reviewed most likely will need medication. Reviewed importance of continuing to increase regular exercise, decrease calories for weight loss/health. SBE's, MVI daily encouraged.    Harrington Challenger [facility], 5:05 PM 09/16/2013

## 2013-09-26 ENCOUNTER — Ambulatory Visit (INDEPENDENT_AMBULATORY_CARE_PROVIDER_SITE_OTHER): Payer: BC Managed Care – PPO | Admitting: Gynecology

## 2013-09-26 ENCOUNTER — Other Ambulatory Visit: Payer: Self-pay | Admitting: Gynecology

## 2013-09-26 ENCOUNTER — Encounter: Payer: Self-pay | Admitting: Gynecology

## 2013-09-26 ENCOUNTER — Other Ambulatory Visit: Payer: Self-pay | Admitting: Women's Health

## 2013-09-26 ENCOUNTER — Other Ambulatory Visit (HOSPITAL_COMMUNITY)
Admission: RE | Admit: 2013-09-26 | Discharge: 2013-09-26 | Disposition: A | Discharge status: Home / Self Care | Payer: BC Managed Care – PPO | Source: Ambulatory Visit | Attending: Gynecology | Admitting: Gynecology

## 2013-09-26 ENCOUNTER — Ambulatory Visit (INDEPENDENT_AMBULATORY_CARE_PROVIDER_SITE_OTHER): Payer: BC Managed Care – PPO

## 2013-09-26 DIAGNOSIS — Z124 Encounter for screening for malignant neoplasm of cervix: Secondary | ICD-10-CM

## 2013-09-26 DIAGNOSIS — N852 Hypertrophy of uterus: Secondary | ICD-10-CM

## 2013-09-26 DIAGNOSIS — N938 Other specified abnormal uterine and vaginal bleeding: Secondary | ICD-10-CM

## 2013-09-26 DIAGNOSIS — N926 Irregular menstruation, unspecified: Secondary | ICD-10-CM

## 2013-09-26 DIAGNOSIS — N949 Unspecified condition associated with female genital organs and menstrual cycle: Secondary | ICD-10-CM

## 2013-09-26 DIAGNOSIS — Z1151 Encounter for screening for human papillomavirus (HPV): Secondary | ICD-10-CM | POA: Insufficient documentation

## 2013-09-26 DIAGNOSIS — N920 Excessive and frequent menstruation with regular cycle: Secondary | ICD-10-CM

## 2013-09-26 DIAGNOSIS — R87810 Cervical high risk human papillomavirus (HPV) DNA test positive: Secondary | ICD-10-CM | POA: Insufficient documentation

## 2013-09-26 NOTE — Progress Notes (Signed)
Patient follows up for sonohysterogram. Recently saw Harriett Sine with a history of regular menses through beginning of this year where she started having more irregular prolonged heavy menses. Was evaluated through [facility] clinic to include a negative endometrial biopsy and was started on Megace initially continuously and now intermittently to control her bleeding. She has monthly menses but they last up to 2 weeks. After several days to a week she will start the Megace to control the bleeding. She recently started a menses early after 2 weeks from her prior menses. Did have TSH which was normal. Currently sexually active using condoms although has not had intercourse in over a month.  Exam External BUS vagina normal. Cervix normal Pap/HPV done. Uterus retroverted gross normal size with a mobile nontender. Adnexa without masses or tenderness.  Ultrasound shows uterus normal size with endometrial echo 6.8 mm. Right and left ovaries grossly normal. Cul-de-sac negative. Sonohysterogram performed, sterile technique, easy catheter introduction, good distention with no abnormalities. Endometrial sample taken. Patient tolerated well.  Assessment and plan: Dysfunctional bleeding pattern treated with intermittent Megace. Recently recognized hypertension with blood pressure 150/98 when she saw Harriett Sine. In the process of following up with [facility] student health for evaluation and treatment. Was overdue for Pap smear noting last Pap smear 2011 and I did one today with HPV. Ultrasound today is normal. Endometrial sample was taken. Patient will followup for results. Options for management reviewed to include continuing progesterone only, Mirena IUD, combination pills. Given her hypertension issues a little reluctant with estrogen containing products. Strong recommendation to try Mirena. Patient thinks that this is what she wants to do and will schedule appointment to followup to have this placed. She'll continue to abstain  from intercourse until placement of the IUD and the importance to do so with stress.

## 2013-09-26 NOTE — Patient Instructions (Signed)
Office will call you with biopsy results.  Followup for Mirena IUD placement asIntrauterine Device Insertion Most often, an intrauterine device (IUD) is inserted into the uterus to prevent pregnancy. There are 2 types of IUDs available:  Copper IUD. This type of IUD creates an environment that is not favorable to sperm survival. The mechanism of action of the copper IUD is not known for certain. It can stay in place for 10 years.  Hormone IUD. This type of IUD contains the hormone progestin (synthetic progesterone). The progestin thickens the cervical mucus and prevents sperm from entering the uterus, and it also thins the uterine lining. There is no evidence that the hormone IUD prevents implantation. The hormone IUD can stay in place for up to 5 years. An IUD is the most cost-effective birth control if left in place for the full duration. It may be removed at any time. LET YOUR CAREGIVER KNOW ABOUT:  Sensitivity to metals.  Medicines taken including herbs, eyedrops, over-the-counter medicines, and creams.  Use of steroids (by mouth or creams).  Previous problems with anesthetics or numbing medicine.  Previous gynecological surgery.  History of blood clots or clotting disorders.  Possibility of pregnancy.  Menstrual irregularities.  Concerns regarding unusual vaginal discharge or odors.  Previous experience with an IUD.  Other health problems. RISKS AND COMPLICATIONS  Accidental puncture (perforation) of the uterus.  Accidental placement of the IUD either in the muscle layer of the uterus (myometrium) or outside the uterus. If this happen, the IUD can be found essentially floating around the bowels. When this happens, the IUD must be taken out surgically.  The IUD may fall out of the uterus (expulsion). This is more common in women who have recently had a child.   Pregnancy in the fallopian tube (ectopic). BEFORE THE PROCEDURE  Schedule the IUD insertion for when you  will have your menstrual period or right after, to make sure you are not pregnant. Placement of the IUD is better tolerated shortly after a menstrual cycle.  You may need to take tests or be examined to make sure you are not pregnant.  You may be required to take a pregnancy test.  You may be required to get checked for sexually transmitted infections (STIs) prior to placement. Placing an IUD in someone who has an infection can make an infection worse.  You may be given a pain reliever to take 1 or 2 hours before the procedure.  An exam will be performed to determine the size and position of your uterus.  Ask your caregiver about changing or stopping your regular medicines. PROCEDURE   A tool (speculum) is placed in the vagina. This allows your caregiver to see the lower part of the uterus (cervix).  The cervix is prepped with a medicine that lowers the risk of infection.  You may be given a medicine to numb each side of the cervix (intracervical or paracervical block). This is used to block and control any discomfort with insertion.  A tool (uterine sound) is inserted into the uterus to determine the length of the uterine cavity and the direction the uterus may be tilted.  A slim instrument (IUD inserter) is inserted through the cervical canal and into your uterus.  The IUD is placed in the uterine cavity and the insertion device is removed.  The nylon string that is attached to the IUD, and used for eventual IUD removal, is trimmed. It is trimmed so that it lays high in the vagina, just  outside the cervix. AFTER THE PROCEDURE  You may have bleeding after the procedure. This is normal. It varies from light spotting for a few days to menstrual-like bleeding.  You may have mild cramping.  Practice checking the string coming out of the cervix to make sure the IUD remains in the uterus. If you cannot feel the string, you should schedule a "string check" with your caregiver.  If you  had a hormone IUD inserted, expect that your period may be lighter or nonexistent within a year's time (though this is not always the case). There may be delayed fertility with the hormone IUD as a result of its progesterone effect. When you are ready to become pregnant, it is suggested to have the IUD removed up to 1 year in advance.  Yearly exams are advised. Document Released: 06/21/2011 Document Revised: 01/15/2012 Document Reviewed: 04/13/2013 [facility] Patient Information 2014 [place], [place].  you choose.

## 2013-09-30 ENCOUNTER — Telehealth: Payer: Self-pay

## 2013-09-30 NOTE — Telephone Encounter (Signed)
If patient is comfortable then schedule the surgery and I will discuss the situation with her at her preop appointment. Surgery slip forwarded to you

## 2013-09-30 NOTE — Telephone Encounter (Signed)
I spoke with patient earlier today about her pathology report. Dr. Velvet Bathe had recommended a consult to discuss D&C due to polyps.  Patient said she has been bleeding since April and she would like to see if we could go ahead and schedule the surgery.

## 2013-10-06 ENCOUNTER — Other Ambulatory Visit: Payer: Self-pay | Admitting: Gynecology

## 2013-10-06 ENCOUNTER — Encounter: Payer: Self-pay | Admitting: Gynecology

## 2013-10-06 MED ORDER — MISOPROSTOL 200 MCG PO TABS: ORAL_TABLET | ORAL | Status: DC

## 2013-10-06 NOTE — Telephone Encounter (Signed)
Patient informed. Surgery scheduled for 10/27/13 at 7:30am.  She has consult with Dr. Velvet Bathe on 10/17/13.

## 2013-10-14 ENCOUNTER — Encounter (HOSPITAL_COMMUNITY): Payer: Self-pay | Admitting: Pharmacy Technician

## 2013-10-17 ENCOUNTER — Encounter (HOSPITAL_COMMUNITY): Payer: Self-pay

## 2013-10-17 ENCOUNTER — Encounter (HOSPITAL_COMMUNITY)
Admission: RE | Admit: 2013-10-17 | Discharge: 2013-10-17 | Disposition: A | Discharge status: Home / Self Care | Payer: BC Managed Care – PPO | Source: Ambulatory Visit | Attending: Gynecology | Admitting: Gynecology

## 2013-10-17 ENCOUNTER — Ambulatory Visit (INDEPENDENT_AMBULATORY_CARE_PROVIDER_SITE_OTHER): Payer: BC Managed Care – PPO | Admitting: Gynecology

## 2013-10-17 ENCOUNTER — Telehealth: Payer: Self-pay | Admitting: Gynecology

## 2013-10-17 ENCOUNTER — Encounter: Payer: Self-pay | Admitting: Gynecology

## 2013-10-17 DIAGNOSIS — N926 Irregular menstruation, unspecified: Secondary | ICD-10-CM

## 2013-10-17 DIAGNOSIS — Z01818 Encounter for other preprocedural examination: Secondary | ICD-10-CM | POA: Insufficient documentation

## 2013-10-17 DIAGNOSIS — N840 Polyp of corpus uteri: Secondary | ICD-10-CM

## 2013-10-17 DIAGNOSIS — Z01812 Encounter for preprocedural laboratory examination: Secondary | ICD-10-CM | POA: Insufficient documentation

## 2013-10-17 HISTORY — DX: Anemia, unspecified: D64.9

## 2013-10-17 HISTORY — DX: Headache: R51

## 2013-10-17 LAB — CBC
HCT: 41.1 % (ref 36.0–46.0)
Hemoglobin: 13.5 g/dL (ref 12.0–15.0)
MCH: 26.9 pg (ref 26.0–34.0)
MCHC: 32.8 g/dL (ref 30.0–36.0)
MCV: 81.9 fL (ref 78.0–100.0)

## 2013-10-17 LAB — COMPREHENSIVE METABOLIC PANEL
Albumin: 3.7 g/dL (ref 3.5–5.2)
Alkaline Phosphatase: 114 U/L (ref 39–117)
BUN: 8 mg/dL (ref 6–23)
Calcium: 9.8 mg/dL (ref 8.4–10.5)
Creatinine, Ser: 1.19 mg/dL — ABNORMAL HIGH (ref 0.50–1.10)
GFR calc Af Amer: 66 mL/min — ABNORMAL LOW (ref >90)
Glucose, Bld: 97 mg/dL (ref 70–99)
Total Protein: 7.8 g/dL (ref 6.0–8.3)

## 2013-10-17 NOTE — H&P (Signed)
  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 G1P0010 with history of regular monthly menses lasting 6-7 days through the spring of 2014. Cycles have become more frequent lasting 10-14 days. Normal TSH and endometrial biopsy at [facility] clinic. Begun on Megace intermittently and has continued to bleed irregularly. Patient had sonohysterogram here which was grossly normal without endometrial abnormalities. Endometrial sample showed endometrioid polyps. Irregular bleeding has continued and the patient's admitted for hysteroscopy D&C assessment of the endometrial cavity.   Past medical history,surgical history, medications, allergies, family history and social history were all reviewed and documented in the EPIC chart. ROS:  Was performed and pertinent positives and negatives are included in the history of present illness.  Exam:  [name] assistant General: well developed, well nourished female, no acute distress HEENT: normal  Lungs: clear to auscultation without wheezing, rales or rhonchi  Cardiac: regular rate without rubs, murmurs or gallops  Abdomen: soft, nontender without masses, guarding, rebound, organomegaly  Pelvic: external bus vagina: normal   Cervix: grossly normal  Uterus: normal size, midline and mobile, nontender  Adnexa: without masses or tenderness      Assessment/Plan:  37 y.o. G1P0010 with continued irregular menses despite intermittent Megace medication. Ultrasound negative with most recent endometrial sample suggestive of endometrial polyps. Patient's admitted for hysteroscopy D&C. Various possibilities were reviewed with the patient to include no pathology found as well as the possibility of endometrial polyps. The expected intraoperative and postoperative courses were reviewed as well as the recovery period. Instrumentation to include the use of  hysteroscope, resectoscope and D&C portion of the procedure was discussed. The risks of infection necessitating prolonged antibiotics and the risk of hemorrhage necessitating transfusion and the risks of transfusion discussed to include transfusion reaction, hepatitis, HIV, mad cow disease, other unknown entities. The risk of uterine perforation and the risk of internal organ damage including bowel, bladder, ureters, nerves necessitating major exploratory reparative surgeries in the future. Surgeries including bowel resection, ostomy formation, lateral repair and ureteral damage repair was all discussed with her. Distending media absorption and the risks of fluid overload, metabolic complications such as coma and seizures was also discussed. Lastly she understands her no guarantees as far as bleeding relief and that her bleeding may continue a regular and heavy following the procedure. We did restart a Mirena IUD and is covered under her insurance. I strongly have recommended that she have a Mirena IUD placed after the D&C for continued menstrual suppression and she agrees with the plan. I discussed what is involved with placement and the risks.  The patient's questions were answered to her satisfaction, her mother was present during the entire counseling session and she did not have any questions also.  Note: This document was prepared with digital dictation and possible smart phrase technology. Any transcriptional errors that result from this process are unintentional.  Dara Lords MD, 5:12 PM 10/17/2013  @LOGO @

## 2013-10-17 NOTE — Progress Notes (Signed)
Patricia Marks [birth year] [id number]   Preoperative consult  Chief complaint: Irregular bleeding, positive high risk HPV Pap smear  History of present illness: 37 y.o. G1P0010 with history of regular monthly menses lasting 6-7 days through the spring of 2014. Cycles have become more frequent lasting 10-14 days. Normal TSH and endometrial biopsy at [facility] clinic. Begun on Megace intermittently and has continued to bleed irregularly. Patient had sonohysterogram here which was grossly normal without endometrial abnormalities. Endometrial sample showed endometrioid polyps. Irregular bleeding has continued and the patient's admitted for hysteroscopy D&C assessment of the endometrial cavity. The patient also had a Pap smear that showed a negative cytology but a positive high risk HPV screen and she is scheduled for colposcopy the first week of January.  Past medical history,surgical history, medications, allergies, family history and social history were all reviewed and documented in the EPIC chart. ROS:  Was performed and pertinent positives and negatives are included in the history of present illness.  Exam:  [name] assistant General: well developed, well nourished female, no acute distress HEENT: normal  Lungs: clear to auscultation without wheezing, rales or rhonchi  Cardiac: regular rate without rubs, murmurs or gallops  Abdomen: soft, nontender without masses, guarding, rebound, organomegaly  Pelvic: external bus vagina: normal   Cervix: grossly normal  Uterus: normal size, midline and mobile, nontender  Adnexa: without masses or tenderness      Assessment/Plan:  37 y.o. G1P0010 with continued irregular menses despite intermittent Megace medication. Ultrasound negative with most recent endometrial sample suggestive of endometrial polyps. Patient's admitted for hysteroscopy D&C. Various possibilities were reviewed with the patient to include no pathology found as well as the possibility  of endometrial polyps. The expected intraoperative and postoperative courses were reviewed as well as the recovery period. Instrumentation to include the use of hysteroscope, resectoscope and D&C portion of the procedure was discussed. The risks of infection necessitating prolonged antibiotics and the risk of hemorrhage necessitating transfusion and the risks of transfusion discussed to include transfusion reaction, hepatitis, HIV, mad cow disease, other unknown entities. The risk of uterine perforation and the risk of internal organ damage including bowel, bladder, ureters, nerves necessitating major exploratory reparative surgeries in the future. Surgeries including bowel resection, ostomy formation, lateral repair and ureteral damage repair was all discussed with her. Distending media absorption and the risks of fluid overload, metabolic complications such as coma and seizures was also discussed. Lastly she understands her no guarantees as far as bleeding relief and that her bleeding may continue a regular and heavy following the procedure. We did restart a Mirena IUD and is covered under her insurance. I strongly have recommended that she have a Mirena IUD placed after the D&C for continued menstrual suppression and she agrees with the plan. I discussed what is involved with placement and the risks. 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 The patient's questions were answered to her satisfaction, her mother was present during the entire counseling session and she did not have any questions also.   Note: This document was prepared with digital dictation and possible smart phrase technology. Any transcriptional errors that result from this process are unintentional.  Dara Lords MD, 3:32 PM 10/17/2013

## 2013-10-17 NOTE — Patient Instructions (Signed)
Followup for surgery as scheduled. 

## 2013-10-17 NOTE — Telephone Encounter (Signed)
10/17/13-Pt was told today that her ins covers the Mirena at 100%. She will get back to [place] if she wants to proceed. Her ins may change in 2015 and I explained we would need to recheck benefits then/[facility]

## 2013-10-17 NOTE — Patient Instructions (Addendum)
   Your procedure is scheduled on: Monday, Dec 22  Enter through the [employer] [facility] at: 6 AM Pick up the phone at the desk and dial [phone] and inform us of your arrival.  Please call this number if you have any problems the morning of surgery: [phone]  Remember: Do not eat or drink after midnight:  Sunday Take these medicines the morning of surgery with a SIP OF WATER:  HCTZ  Do not wear jewelry, make-up, or FINGER nail polish No metal in your hair or on your body. Do not wear lotions, powders, perfumes. You may wear deodorant.  Please use your CHG wash as directed prior to surgery.   Do not bring valuables to the hospital. Contacts, dentures or bridgework may not be worn into surgery.  Patients discharged on the day of surgery will not be allowed to drive home.  Home with mother Macie Baum.

## 2013-10-22 ENCOUNTER — Encounter: Payer: Self-pay | Admitting: Gynecology

## 2013-10-22 ENCOUNTER — Ambulatory Visit (INDEPENDENT_AMBULATORY_CARE_PROVIDER_SITE_OTHER): Payer: BC Managed Care – PPO | Admitting: Gynecology

## 2013-10-22 DIAGNOSIS — R87810 Cervical high risk human papillomavirus (HPV) DNA test positive: Secondary | ICD-10-CM

## 2013-10-22 MED ORDER — MEGESTROL ACETATE 20 MG PO TABS: 20.0000 mg | ORAL_TABLET | Freq: Every day | ORAL | Status: DC

## 2013-10-22 MED ORDER — IBUPROFEN 800 MG PO TABS: 800.0000 mg | ORAL_TABLET | Freq: Three times a day (TID) | ORAL | Status: DC | PRN

## 2013-10-22 NOTE — Progress Notes (Signed)
Patient ID: Rim Thatch, female   DOB: 1976/06/07, 37 y.o.   MRN: [id number] Patient presents for colposcopy. History of first abnormal Pap smear showing benign cytology with positive high-risk HPV. Currently undergoing evaluation and planned hysteroscopy D&C for dysfunctional bleeding.  Exam with Berenice Bouton External BUS vagina with light menses flow. Cervix grossly normal. Uterus grossly normal size midline mobile nontender. Adnexa without masses or tenderness  Colposcopy after acetic acid cleanse adequate showing acetowhite area 6:00 transformation zone, representative biopsy taken, ECC performed. Physical Exam  Genitourinary:      Assessment and plan: First abnormal Pap smear showing positive high-risk HPV with normal cytology. Undergoing hysteroscopy D&C for prolonged dysfunctional bleeding. Colposcopy shows acetowhite change 6:00 transformation zone. Representative biopsy taken, ECC performed, patient will followup for results.

## 2013-10-22 NOTE — Patient Instructions (Signed)
Office will call you with the biopsy results 

## 2013-10-26 MED ORDER — DEXTROSE 5 % IV SOLN
2.0000 g | INTRAVENOUS | Status: AC
  Administered 2013-10-27: 2 g via INTRAVENOUS
  Filled 2013-10-26: qty 2

## 2013-10-26 NOTE — Anesthesia Preprocedure Evaluation (Addendum)
Anesthesia Evaluation  Patient identified by MRN, date of birth, ID band Patient awake    Reviewed: Allergy & Precautions, H&P , NPO status , Patient's Chart, lab work & pertinent test results  Airway Mallampati: II TM Distance: >3 FB Neck ROM: Full    Dental  (+) Dental Advisory Given and Teeth Intact   Pulmonary neg pulmonary ROS,  breath sounds clear to auscultation        Cardiovascular hypertension, Pt. on medications Rhythm:Regular Rate:Normal     Neuro/Psych  Headaches, negative psych ROS   GI/Hepatic negative GI ROS, Neg liver ROS,   Endo/Other  Morbid obesity  Renal/GU negative Renal ROS     Musculoskeletal negative musculoskeletal ROS (+)   Abdominal (+) + obese,   Peds  Hematology  (+) anemia ,   Anesthesia Other Findings   Reproductive/Obstetrics negative OB ROS                         Anesthesia Physical Anesthesia Plan  ASA: III  Anesthesia Plan: General LMA   Post-op Pain Management:    Induction:   Airway Management Planned:   Additional Equipment:   Intra-op Plan:   Post-operative Plan:   Informed Consent: I have reviewed the patients History and Physical, chart, labs and discussed the procedure including the risks, benefits and alternatives for the proposed anesthesia with the patient or authorized representative who has indicated his/her understanding and acceptance.   Dental advisory given  Plan Discussed with: CRNA and Surgeon  Anesthesia Plan Comments:        Anesthesia Quick Evaluation

## 2013-10-27 ENCOUNTER — Ambulatory Visit (HOSPITAL_COMMUNITY)
Admission: RE | Admit: 2013-10-27 | Discharge: 2013-10-27 | Disposition: A | Discharge status: Home / Self Care | Payer: BC Managed Care – PPO | Source: Ambulatory Visit | Attending: Gynecology | Admitting: Gynecology

## 2013-10-27 ENCOUNTER — Ambulatory Visit (HOSPITAL_COMMUNITY): Payer: BC Managed Care – PPO | Admitting: Anesthesiology

## 2013-10-27 ENCOUNTER — Encounter (HOSPITAL_COMMUNITY): Payer: Self-pay | Admitting: Anesthesiology

## 2013-10-27 ENCOUNTER — Encounter (HOSPITAL_COMMUNITY): Payer: BC Managed Care – PPO | Admitting: Anesthesiology

## 2013-10-27 ENCOUNTER — Encounter (HOSPITAL_COMMUNITY)
Admission: RE | Disposition: A | Discharge status: Home / Self Care | Payer: Self-pay | Source: Ambulatory Visit | Attending: Gynecology

## 2013-10-27 DIAGNOSIS — N926 Irregular menstruation, unspecified: Secondary | ICD-10-CM | POA: Insufficient documentation

## 2013-10-27 DIAGNOSIS — N879 Dysplasia of cervix uteri, unspecified: Secondary | ICD-10-CM | POA: Insufficient documentation

## 2013-10-27 DIAGNOSIS — N949 Unspecified condition associated with female genital organs and menstrual cycle: Secondary | ICD-10-CM

## 2013-10-27 DIAGNOSIS — N840 Polyp of corpus uteri: Secondary | ICD-10-CM

## 2013-10-27 HISTORY — PX: DILATATION & CURRETTAGE/HYSTEROSCOPY WITH RESECTOCOPE: SHX5572

## 2013-10-27 LAB — HCG, SERUM, QUALITATIVE: Preg, Serum: NEGATIVE

## 2013-10-27 LAB — TSH: TSH: 1.924 u[IU]/mL (ref 0.350–4.500)

## 2013-10-27 SURGERY — DILATATION & CURETTAGE/HYSTEROSCOPY WITH RESECTOCOPE
Anesthesia: General | Site: Uterus

## 2013-10-27 MED ORDER — MIDAZOLAM HCL 2 MG/2ML IJ SOLN
INTRAMUSCULAR | Status: AC
  Filled 2013-10-27: qty 2

## 2013-10-27 MED ORDER — KETOROLAC TROMETHAMINE 30 MG/ML IJ SOLN
INTRAMUSCULAR | Status: DC | PRN
  Administered 2013-10-27: 30 mg via INTRAVENOUS

## 2013-10-27 MED ORDER — ACETAMINOPHEN 160 MG/5ML PO SOLN
ORAL | Status: AC
  Filled 2013-10-27: qty 40.6

## 2013-10-27 MED ORDER — MIDAZOLAM HCL 2 MG/2ML IJ SOLN
INTRAMUSCULAR | Status: DC | PRN
  Administered 2013-10-27: 2 mg via INTRAVENOUS

## 2013-10-27 MED ORDER — LIDOCAINE HCL (CARDIAC) 20 MG/ML IV SOLN
INTRAVENOUS | Status: AC
  Filled 2013-10-27: qty 5

## 2013-10-27 MED ORDER — FENTANYL CITRATE 0.05 MG/ML IJ SOLN
INTRAMUSCULAR | Status: AC
  Filled 2013-10-27: qty 2

## 2013-10-27 MED ORDER — KETOROLAC TROMETHAMINE 30 MG/ML IJ SOLN: 15.0000 mg | Freq: Once | INTRAMUSCULAR | Status: DC | PRN

## 2013-10-27 MED ORDER — FENTANYL CITRATE 0.05 MG/ML IJ SOLN
INTRAMUSCULAR | Status: DC | PRN
  Administered 2013-10-27: 50 ug via INTRAVENOUS

## 2013-10-27 MED ORDER — PROPOFOL 10 MG/ML IV EMUL
INTRAVENOUS | Status: AC
  Filled 2013-10-27: qty 20

## 2013-10-27 MED ORDER — ONDANSETRON HCL 4 MG/2ML IJ SOLN
INTRAMUSCULAR | Status: AC
  Filled 2013-10-27: qty 2

## 2013-10-27 MED ORDER — ONDANSETRON HCL 4 MG/2ML IJ SOLN
INTRAMUSCULAR | Status: DC | PRN
  Administered 2013-10-27: 4 mg via INTRAVENOUS

## 2013-10-27 MED ORDER — LIDOCAINE HCL 1 % IJ SOLN
INTRAMUSCULAR | Status: AC
  Filled 2013-10-27: qty 20

## 2013-10-27 MED ORDER — FENTANYL CITRATE 0.05 MG/ML IJ SOLN: 25.0000 ug | INTRAMUSCULAR | Status: DC | PRN

## 2013-10-27 MED ORDER — LIDOCAINE HCL (CARDIAC) 20 MG/ML IV SOLN
INTRAVENOUS | Status: DC | PRN
  Administered 2013-10-27: 30 mg via INTRAVENOUS
  Administered 2013-10-27: 70 mg via INTRAVENOUS

## 2013-10-27 MED ORDER — KETOROLAC TROMETHAMINE 30 MG/ML IJ SOLN
INTRAMUSCULAR | Status: AC
  Filled 2013-10-27: qty 1

## 2013-10-27 MED ORDER — METOCLOPRAMIDE HCL 5 MG/ML IJ SOLN: 10.0000 mg | Freq: Once | INTRAMUSCULAR | Status: DC | PRN

## 2013-10-27 MED ORDER — MEPERIDINE HCL 25 MG/ML IJ SOLN: 6.2500 mg | INTRAMUSCULAR | Status: DC | PRN

## 2013-10-27 MED ORDER — PROPOFOL 10 MG/ML IV BOLUS
INTRAVENOUS | Status: DC | PRN
  Administered 2013-10-27: 160 mg via INTRAVENOUS

## 2013-10-27 MED ORDER — HYDROCODONE-ACETAMINOPHEN 5-325 MG PO TABS: 1.0000 | ORAL_TABLET | Freq: Four times a day (QID) | ORAL | Status: DC | PRN

## 2013-10-27 MED ORDER — LACTATED RINGERS IV SOLN
INTRAVENOUS | Status: DC
  Administered 2013-10-27: 125 mL/h via INTRAVENOUS

## 2013-10-27 MED ORDER — DEXAMETHASONE SODIUM PHOSPHATE 10 MG/ML IJ SOLN
INTRAMUSCULAR | Status: AC
  Filled 2013-10-27: qty 1

## 2013-10-27 MED ORDER — LIDOCAINE HCL 1 % IJ SOLN
INTRAMUSCULAR | Status: DC | PRN
  Administered 2013-10-27: 10 mL

## 2013-10-27 SURGICAL SUPPLY — 17 items
CANISTER SUCT 3000ML (MISCELLANEOUS) ×2 IMPLANT
CATH ROBINSON RED A/P 16FR (CATHETERS) ×2 IMPLANT
CLOTH BEACON ORANGE TIMEOUT ST (SAFETY) ×2 IMPLANT
CONTAINER PREFILL 10% NBF 60ML (FORM) ×4 IMPLANT
DRSG TELFA 3X8 NADH (GAUZE/BANDAGES/DRESSINGS) ×2 IMPLANT
ELECT REM PT RETURN 9FT ADLT (ELECTROSURGICAL)
ELECTRODE REM PT RTRN 9FT ADLT (ELECTROSURGICAL) IMPLANT
GLOVE BIO SURGEON STRL SZ7.5 (GLOVE) ×2 IMPLANT
GOWN STRL REIN XL XLG (GOWN DISPOSABLE) ×4 IMPLANT
LOOP ANGLED CUTTING 22FR (CUTTING LOOP) ×2 IMPLANT
NDL SPNL 22GX3.5 QUINCKE BK (NEEDLE) IMPLANT
NEEDLE SPNL 22GX3.5 QUINCKE BK (NEEDLE) IMPLANT
PACK HYSTEROSCOPY LF (CUSTOM PROCEDURE TRAY) ×2 IMPLANT
PAD DRESSING TELFA 3X8 NADH (GAUZE/BANDAGES/DRESSINGS) ×1 IMPLANT
PAD OB MATERNITY 4.3X12.25 (PERSONAL CARE ITEMS) ×2 IMPLANT
TOWEL OR 17X24 6PK STRL BLUE (TOWEL DISPOSABLE) ×4 IMPLANT
WATER STERILE IRR 1000ML POUR (IV SOLUTION) ×2 IMPLANT

## 2013-10-27 NOTE — Anesthesia Postprocedure Evaluation (Signed)
  Anesthesia Post-op Note  Anesthesia Post Note  Patient: Patricia Marks  Procedure(s) Performed: Procedure(s) (LRB): DILATATION & CURETTAGE/HYSTEROSCOPY (N/A)  Anesthesia type: General  Patient location: PACU  Post pain: Pain level controlled  Post assessment: Post-op Vital signs reviewed  Last Vitals:  Filed Vitals:   10/27/13 0854  BP:   Pulse:   Temp: 37 C  Resp:     Post vital signs: Reviewed  Level of consciousness: sedated  Complications: No apparent anesthesia complications

## 2013-10-27 NOTE — Op Note (Signed)
Patricia Marks 05-23-1976 [id number]   Post Operative Note   Date of surgery:  10/27/2013  Pre Op Dx:  Irregular menses, cervical dysplasia  Post Op Dx:  Same  Procedure:  Hysteroscopy D&C  Surgeon:  Dara Lords  Anesthesia:  General  EBL:  Minimal  Distended media discrepancy:  Minimal  Complications:  None  Specimen:  #1 endocervical curetting #2 endometrial curettings to pathology  Findings: EUA:  External BUS vagina normal. Cervix normal. Uterus retroverted normal size midline mobile. Adnexa without masses   Hysteroscopy:  Adequate noting fundus, anterior/posterior uterine surfaces, lower uterine segment, endocervical canal, right/left tubal ostia all visualized. Diffuse hyperplastic endometrial pattern with multiple small polypoid growths carpeting the endometrial cavity. No single distinct endometrial polyp noted.  Procedure:  The patient was taken to the operating room, underwent general anesthesia, was placed in the low dorsal lithotomy position, received a perineal/vaginal preparation with Betadine solution, the bladder was emptied with an in and out Foley catheterization per nursing personnel and the patient was draped in the usual fashion. The time out was performed by the surgical team. The cervix was visualized with a speculum, anterior lip grasped with a single-tooth tenaculum and a paracervical block was placed using 1% lidocaine, 10 cc total. The diagnostic hysteroscope was placed without difficulty and hysteroscopy was performed with findings noted above. An ECC was performed followed by an [facility] with specimens sent to pathology separately. Repeat hysteroscopy showed an empty cavity, good distention with no evidence of perforation. The tenaculum was removed and adequate hemostasis was visualized at the tenaculum site and the external cervical os. The patient received intraoperative Toradol, was awakened without difficulty and was taken to the recovery room in good  condition having tolerated the procedure well.  Note: This document was prepared with digital dictation and possible smart phrase technology. Any transcriptional errors that result from this process are unintentional.  Dara Lords MD, 8:12 AM 10/27/2013

## 2013-10-27 NOTE — Transfer of Care (Signed)
Immediate Anesthesia Transfer of Care Note  Patient: Patricia Marks  Procedure(s) Performed: Procedure(s): DILATATION & CURETTAGE/HYSTEROSCOPY (N/A)  Patient Location: PACU  Anesthesia Type:General  Level of Consciousness: awake, alert  and oriented  Airway & Oxygen Therapy: Patient Spontanous Breathing and Patient connected to nasal cannula oxygen  Post-op Assessment: Report given to PACU RN  Post vital signs: Reviewed  Complications: No apparent anesthesia complications

## 2013-10-27 NOTE — H&P (Addendum)
  The patient was examined.  I reviewed the proposed surgery and consent form with the patient.  The dictated history and physical is current and accurate and all questions were answered. The patient is ready to proceed with surgery and has a realistic understanding and expectation for the outcome.   Dara Lords MD, 7:13 AM 10/27/2013

## 2013-10-28 ENCOUNTER — Encounter (HOSPITAL_COMMUNITY): Payer: Self-pay | Admitting: Gynecology

## 2013-11-03 ENCOUNTER — Encounter: Payer: Self-pay | Admitting: Gynecology

## 2013-11-03 ENCOUNTER — Ambulatory Visit (INDEPENDENT_AMBULATORY_CARE_PROVIDER_SITE_OTHER): Payer: BC Managed Care – PPO | Admitting: Gynecology

## 2013-11-03 DIAGNOSIS — Z9889 Other specified postprocedural states: Secondary | ICD-10-CM

## 2013-11-03 DIAGNOSIS — Z30430 Encounter for insertion of intrauterine contraceptive device: Secondary | ICD-10-CM

## 2013-11-03 HISTORY — PX: INTRAUTERINE DEVICE INSERTION: SHX323

## 2013-11-03 NOTE — Patient Instructions (Signed)
Intrauterine Device Insertion Most often, an intrauterine device (IUD) is inserted into the uterus to prevent pregnancy. There are 2 types of IUDs available:  Copper IUD. This type of IUD creates an environment that is not favorable to sperm survival. The mechanism of action of the copper IUD is not known for certain. It can stay in place for 10 years.  Hormone IUD. This type of IUD contains the hormone progestin (synthetic progesterone). The progestin thickens the cervical mucus and prevents sperm from entering the uterus, and it also thins the uterine lining. There is no evidence that the hormone IUD prevents implantation. The hormone IUD can stay in place for up to 5 years. An IUD is the most cost-effective birth control if left in place for the full duration. It may be removed at any time. LET YOUR CAREGIVER KNOW ABOUT:  Sensitivity to metals.  Medicines taken including herbs, eyedrops, over-the-counter medicines, and creams.  Use of steroids (by mouth or creams).  Previous problems with anesthetics or numbing medicine.  Previous gynecological surgery.  History of blood clots or clotting disorders.  Possibility of pregnancy.  Menstrual irregularities.  Concerns regarding unusual vaginal discharge or odors.  Previous experience with an IUD.  Other health problems. RISKS AND COMPLICATIONS  Accidental puncture (perforation) of the uterus.  Accidental placement of the IUD either in the muscle layer of the uterus (myometrium) or outside the uterus. If this happen, the IUD can be found essentially floating around the bowels. When this happens, the IUD must be taken out surgically.  The IUD may fall out of the uterus (expulsion). This is more common in women who have recently had a child.   Pregnancy in the fallopian tube (ectopic). BEFORE THE PROCEDURE  Schedule the IUD insertion for when you will have your menstrual period or right after, to make sure you are not pregnant.  Placement of the IUD is better tolerated shortly after a menstrual cycle.  You may need to take tests or be examined to make sure you are not pregnant.  You may be required to take a pregnancy test.  You may be required to get checked for sexually transmitted infections (STIs) prior to placement. Placing an IUD in someone who has an infection can make an infection worse.  You may be given a pain reliever to take 1 or 2 hours before the procedure.  An exam will be performed to determine the size and position of your uterus.  Ask your caregiver about changing or stopping your regular medicines. PROCEDURE   A tool (speculum) is placed in the vagina. This allows your caregiver to see the lower part of the uterus (cervix).  The cervix is prepped with a medicine that lowers the risk of infection.  You may be given a medicine to numb each side of the cervix (intracervical or paracervical block). This is used to block and control any discomfort with insertion.  A tool (uterine sound) is inserted into the uterus to determine the length of the uterine cavity and the direction the uterus may be tilted.  A slim instrument (IUD inserter) is inserted through the cervical canal and into your uterus.  The IUD is placed in the uterine cavity and the insertion device is removed.  The nylon string that is attached to the IUD, and used for eventual IUD removal, is trimmed. It is trimmed so that it lays high in the vagina, just outside the cervix. AFTER THE PROCEDURE  You may have bleeding after the   procedure. This is normal. It varies from light spotting for a few days to menstrual-like bleeding.  You may have mild cramping.  Practice checking the string coming out of the cervix to make sure the IUD remains in the uterus. If you cannot feel the string, you should schedule a "string check" with your caregiver.  If you had a hormone IUD inserted, expect that your period may be lighter or nonexistent  within a year's time (though this is not always the case). There may be delayed fertility with the hormone IUD as a result of its progesterone effect. When you are ready to become pregnant, it is suggested to have the IUD removed up to 1 year in advance.  Yearly exams are advised. Document Released: 06/21/2011 Document Revised: 01/15/2012 Document Reviewed: 04/13/2013 [employer] Patient Information 2014 [employer].  

## 2013-11-03 NOTE — Progress Notes (Signed)
She presents with 3 separate issues: 1. Postoperative visit status post hysteroscopy D&C. 2. Recent cervical biopsy showing CIN 2. 3.  Mirena IUD placement. She has read through the booklet, has no contraindications and signed the consent form. Has not had intercourse in months. Certainly not since the [facility]  I reviewed the insertional process with her as well as the risks to include infection, either immediate or long-term, uterine perforation or migration requiring surgery to remove, other complications such as pain, hormonal side effects, infertility and possibility of failure with subsequent pregnancy.   Exam with Sherrilyn Rist assistant Pelvic: External BUS vagina normal. Cervix normal with scant bleeding. Uterus retroverted normal size shape contour midline mobile nontender. Adnexa without masses or tenderness.  Procedure: The cervix was cleansed with Betadine, anterior lip grasped with a single-tooth tenaculum, the uterus was sounded and a Mirena IUD was placed according to manufacturer's recommendations without difficulty. The strings were trimmed. The patient tolerated well and will follow up in one month for a postinsertional check.  Lot number:  [id number]  Assessment and plan: 1. Postoperative visit status post hysteroscopy D&C. Patient has done well. Pathology benign. We'll monitor bleeding at present. 2. Mirena IUD placement as above. Followup in one month for postinsertional check. 3. 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 Pap smear proceeding showed benign cytology but positive high-risk HPV. Colposcopy showed an acetowhite area 6:00 transformation sound. Biopsy was moderate dysplasia, negative ECC. ECC portion of the recent D&C was also benign without evidence of dysplasia. Options for management include treatment now with LEEP or other procedures such as cryo- laser cone versus expectant management. The issues of progression versus regression and positive high-risk HPV reviewed. She has no  children and desires future childbearing and the issues of treatment with potential for cervical issues such as increased risk of miscarriages, previable, peri-viable and premature deliveries reviewed. The absolute need for followup with potential to develop cervical cancer also stressed. Ultimately we both decided on expectant management with repeat colposcopy in 4 months. She understands she may be postponing the inevitable and that we may be proceeding with treatment at that time. We'll go ahead and schedule her colposcopy appointment 4 months from now at this time.    Note: This document was prepared with digital dictation and possible smart phrase technology. Any transcriptional errors that result from this process are unintentional.  Dara Lords MD, 10:45 AM 11/03/2013

## 2013-11-11 ENCOUNTER — Telehealth: Payer: Self-pay | Admitting: *Deleted

## 2013-11-11 MED ORDER — MEGESTROL ACETATE 20 MG PO TABS: 20.0000 mg | ORAL_TABLET | Freq: Every day | ORAL | Status: DC

## 2013-11-11 NOTE — Telephone Encounter (Signed)
Megace 20 mg daily x2 weeks

## 2013-11-11 NOTE — Telephone Encounter (Signed)
Pt had D & C on 10/27/13, mirena IUD placed on 11/03/13 still c/o bleeding and cramping. Pt said she is wearing a pad changing every 2-3 hours bright red, not heavy , but steady  was taking motrin 800 mg for cramping last week but stopped because it made her feel constipated . Pt said she has really been bleeding since Nov. I explained to patient could be related to IUD placement, but I would relay information for you recommendations. Please advise

## 2013-11-11 NOTE — Telephone Encounter (Signed)
rx sent , pt informed with the below note. 

## 2013-11-13 ENCOUNTER — Ambulatory Visit: Payer: BC Managed Care – PPO | Admitting: Gynecology

## 2013-11-17 ENCOUNTER — Telehealth: Payer: Self-pay | Admitting: *Deleted

## 2013-11-17 NOTE — Telephone Encounter (Signed)
Pt calling to follow up from telephone encounter 11/11/13 pt has some left megace left from visit in December has been taking twice daily rather that once daily, still bleeding, dark blood on pad, but when wiping bright red blood. She did start twice daily on 11/11/13 asked if she should continue taking twice daily for another week? Or daily for another week as directed on 11/11/13 encounter. Please advise

## 2013-11-17 NOTE — Telephone Encounter (Signed)
Pt informed with the below note. 

## 2013-11-17 NOTE — Telephone Encounter (Signed)
Once daily to finish and then stop and monitor bleeding

## 2013-11-27 ENCOUNTER — Telehealth: Payer: Self-pay | Admitting: *Deleted

## 2013-11-27 NOTE — Telephone Encounter (Signed)
Pt called to follow up from telephone encounter 11/17/13 completed the megace had about 4 day of no bleeding. Pt said this am she noticed some blood when wiping and in toilet. Pt has appointment on 12/01/13 for C &B and IUD check up. She does have some megace left over, asked if she should watch for now? continue megace daily until C & B on Monday? Please advise

## 2013-11-27 NOTE — Telephone Encounter (Signed)
Watch for now, followup and follow up at the colposcopy appointment

## 2013-11-27 NOTE — Telephone Encounter (Signed)
Pt informed with the below note. 

## 2013-12-01 ENCOUNTER — Encounter: Payer: Self-pay | Admitting: Gynecology

## 2013-12-01 ENCOUNTER — Ambulatory Visit (INDEPENDENT_AMBULATORY_CARE_PROVIDER_SITE_OTHER): Payer: Self-pay | Admitting: Gynecology

## 2013-12-01 DIAGNOSIS — N871 Moderate cervical dysplasia: Secondary | ICD-10-CM

## 2013-12-01 DIAGNOSIS — T839XXA Unspecified complication of genitourinary prosthetic device, implant and graft, initial encounter: Secondary | ICD-10-CM

## 2013-12-01 NOTE — Progress Notes (Signed)
Patient presents for IUD followup exam. She has been having some bleeding on and off since placement and was treated with a course of Megace. Currently is not bleeding. Also with history of high-grade dysplasia (moderate dysplasia) on biopsy negative ECC with planned expectant management and repeat colposcopy in 4-6 months from biopsy December 2014.  Exam was Radiographer, therapeuticKim assistant External BUS vagina normal. Cervix normal. IUD string visualized an appropriate length. Uterus axial to retroverted. Normal size midline mobile nontender. Adnexa without masses or tenderness.  Assessment and plan: 1. IUD followup exam. Recommended no further Megace but monitor at present. Will keep menstrual calendar. As long as no significant atypical bleeding we'll follow. Otherwise we'll presents sooner if any issues. 2. High-grade dysplasia on cervical biopsy 10/2013. ECC negative at the colposcopy appointment as well as on D&C specimen. Recommend repeat colposcopy 4-6 months April through June. Absolute need for followup and potential to progress onto cervical cancer was stressed. We've elected expectant management at this point given her desires for future childbearing.

## 2013-12-01 NOTE — Patient Instructions (Signed)
Followup for colposcopy appointment April through June 2015.

## 2013-12-18 ENCOUNTER — Telehealth: Payer: Self-pay | Admitting: *Deleted

## 2013-12-18 MED ORDER — ESTRADIOL 1 MG PO TABS
1.0000 mg | ORAL_TABLET | Freq: Every day | ORAL | Status: DC
Start: 2013-12-18 — End: 2014-03-31

## 2013-12-18 NOTE — Telephone Encounter (Signed)
Pt informed with the below note, rx sent. 

## 2013-12-18 NOTE — Telephone Encounter (Signed)
Try one other thing. We did treat her with Megace which apparently did not help. Recommend estradiol 1 mg tablet daily x2 weeks and we'll see if this doesn't stabilize the uterine lining. If irregular bleeding continues then I will be patient's decision as to whether to try to watch it a little bit longer or have the IUD removed.

## 2013-12-18 NOTE — Telephone Encounter (Signed)
Pt calling c/o bleeding still, has IUD, was seen on 12/01/13 for C & B. Pt said she is crying everyday because of the bleeding has light bleeding some days and the heavy other days. She is very frustrated and unsure of why the bleeding will not stop, she thought maybe the IUD should be removed? To see what her body does then? Recommendations? Please advise

## 2014-02-19 ENCOUNTER — Ambulatory Visit: Payer: Self-pay | Admitting: Gynecology

## 2014-03-04 ENCOUNTER — Encounter: Payer: Self-pay | Admitting: Gynecology

## 2014-03-04 ENCOUNTER — Ambulatory Visit (INDEPENDENT_AMBULATORY_CARE_PROVIDER_SITE_OTHER): Payer: Self-pay | Admitting: Gynecology

## 2014-03-04 DIAGNOSIS — N871 Moderate cervical dysplasia: Secondary | ICD-10-CM

## 2014-03-04 NOTE — Patient Instructions (Signed)
Office will call you with biopsy results 

## 2014-03-04 NOTE — Progress Notes (Signed)
Patient ID: Patricia Marks, female   DOB: 11/07/1975, 38 y.o.   MRN: [id number] Patricia Marks 11/03/1976 [id number]        38 y.o.  G1P0010 presents for colposcopy. History of Pap smear December 2014 normal cytology but positive high risk HPV. Colposcopy showed acetowhite change at 6:00 with biopsy showing CIN-2 ECC negative. Patient elected for expectant management. Returns now for colposcopy. Has had Mirena IUD placed in the interim. Continues to have some spotting on and off since placement in December. Menses though are much lighter.  Past medical history,surgical history, problem list, medications, allergies, family history and social history were all reviewed and documented in the EPIC chart.  Directed ROS with pertinent positives and negatives documented in the history of present illness/assessment and plan.  Exam: [name] assistant General appearance  Normal External BUS vagina normal. Cervix grossly normal with IUD string visualized. Bimanual uterus normal size midline mobile nontender. Adnexa without masses or tenderness.  Colposcopy after acetic acid cleanse is adequate with no abnormality seen. Random biopsy at 12 and 6:00 taken with ECC. Care to avoid the string.  Assessment/Plan:  38 y.o. G1P0010 : 1. CIN-2  at 6:00 biopsy 10/2013. Colposcopy today adequate normal. Random 12 and 6:00 biopsies with ECC taken. Patient will follow up for results and will triaged based upon results. 2. IUD management. Patient continues to spot on and off. It is actually getting better over time. Patient is frustrated. I asked her to hold on a little while longer as she was contemplating having the IUD removed. Patient will monitor at present and then followup as needed otherwise will followup in the fall when she is due for her annual.   Note: This document was prepared with digital dictation and possible smart phrase technology. Any transcriptional errors that result from this process are  unintentional.   Dara Lordsimothy P Alynna Hargrove MD, 3:20 PM 03/04/2014

## 2014-03-06 ENCOUNTER — Telehealth: Payer: Self-pay

## 2014-03-06 NOTE — Telephone Encounter (Signed)
Patient asked about price of LEEP. She is private pay. Per Laurel DimmerKim H. With discount $387.50 if paid day of service.  Patient advised.  Patient said she needs to discuss this and figure out finances.  I encouraged her to not to put it off too long since high grade atypia.  She will call for appt.

## 2014-03-06 NOTE — Telephone Encounter (Signed)
Message copied by Keenan BachelorANNAS, KATHERINE R on Fri Mar 06, 2014  2:17 PM ------      Message from: Dara LordsFONTAINE, TIMOTHY P      Created: Fri Mar 06, 2014  1:58 PM       Tell patient that the scraping part of the biopsy showed high-grade atypia. This is higher where we cannot really see. At this point we need to proceed with LEEP. If she is comfortable with scheduling that appointment then go ahead and schedule a LEEP appointment. If not I would be more than happy to meet with her where we can go over the results and I'll explain it to her in detail and schedule her an appointment. ------

## 2014-03-31 ENCOUNTER — Ambulatory Visit (INDEPENDENT_AMBULATORY_CARE_PROVIDER_SITE_OTHER): Payer: Self-pay | Admitting: Gynecology

## 2014-03-31 ENCOUNTER — Encounter: Payer: Self-pay | Admitting: Gynecology

## 2014-03-31 DIAGNOSIS — IMO0002 Reserved for concepts with insufficient information to code with codable children: Secondary | ICD-10-CM

## 2014-03-31 DIAGNOSIS — R6889 Other general symptoms and signs: Secondary | ICD-10-CM

## 2014-03-31 HISTORY — PX: CERVICAL BIOPSY  W/ LOOP ELECTRODE EXCISION: SUR135

## 2014-03-31 NOTE — Progress Notes (Signed)
The patient presents for LEEP biopsy. I reviewed her indications for LEEP: History of Pap smear December 2014 wih normal cytology but positive high risk HPV. Colposcopy showed acetowhite change at 6:00 with biopsy showing CIN-2, ECC negative. Patient elected for expectant management.  Repeat colposcopy 02/2014 was adequate and normal. Random biopsy at 12 and 6:00 negative the ECC was positive for high-grade dysplasia. We again reviewed the options to include continued expectant management versus LEEP and the patient agrees to proceed with LEEP.  I reviewed the procedure with her to include the risks of infection, bleeding requiring retreatment and damage to surrounding tissues including vagina, bladder and rectum. I reviewed the possible risks of infertility, miscarriage and preterm delivery associated with cervical treatments to include previable, peri-viable and early preterm deliveries.  The various pathology result possibilities were reviewed with her to include pathology found with complete excision, cut through lesions with need for possible future treatments, as well as no pathology found.  She understands that these lesions are HPV related and that we are not eradicating the virus, with possibilities of recurrences in the future. Lastly I reviewed her IUD and the possibilities that the LEEP could disrupt and cause loss of the IUD. The risks of transecting the strings and making removal difficult also discussed. Patient understands and accepts the above.   Procedure:   A pelvic exam was performed and was normal.   The patient was properly grounded and prepared for the procedure. The cervix was visualized with a LEEP speculum cleansed with acetic acid and re-colposcopy performed which was adequate. An area of acetowhite change was noted at the 12:00 transformations. The cervix was circumferentially injected with 2% lidocaine solution with epinephrine 1 / 100,000 dilution, a total of 8 cc's were used. The  cervix was then stained with Lugol's solution, which allowed for visualization of the transformation zone grossly. The LEEP generator was set at 103 W cutting 60 W coagulation blend one current. The IUD string was then pushed up into the higher endocervical canal with the dressing forceps and using the 20 x 12 LEEP wand, the LEEP specimen was excised in a single pass. An ECC was performed following this taking care to avoid the IUD string.  Ball coagulation was delivered to the LEEP base to achieve hemostasis and prophylactic Monsel's solution was applied. The specimen was cut open at 3 o'clock and pinned open on the cork and sent to pathology. The patient tolerated the procedure well and postoperative instructions were discussed with her and a postoperative instruction sheet was given to her. Patient knows to follow up for pathology results in several days and then we will discuss the long-term plan as far as follow up screening.   Dara Lords MD, 3:13 PM 03/31/2014

## 2014-03-31 NOTE — Patient Instructions (Signed)
Loop Electrosurgical Excision Procedure Care After Refer to this sheet in the next few weeks. These instructions provide you with information on caring for yourself after your procedure. Your caregiver may also give you more specific instructions. Your treatment has been planned according to current medical practices, but problems sometimes occur. Call your caregiver if you have any problems or questions after your procedure. HOME CARE INSTRUCTIONS   Do not use tampons, douche, or have sexual intercourse for 2 weeks or as directed by your caregiver.  Begin normal activities if you have no or minimal cramping or bleeding, unless directed otherwise by your caregiver.  Take your temperature if you feel sick. Write down your temperature on paper, and tell your caregiver if you have a fever.  Take all medicines as directed by your caregiver.  Keep all your follow-up appointments and Pap tests as directed by your caregiver. SEEK IMMEDIATE MEDICAL CARE IF:   You have bleeding that is heavier or longer than a normal menstrual cycle.  You have bleeding that is bright red.  You have blood clots.  You have a fever.  You have increasing cramps or pain not relieved by medicine.  You develop abdominal pain that does not seem to be related to the same area of earlier cramping and pain.  You are lightheaded, unusually weak, or faint.  You develop painful or bloody urination.  You develop a bad smelling vaginal discharge. MAKE SURE YOU:  Understand these instructions.  Will watch your condition.  Will get help right away if you are not doing well or get worse. Document Released: 07/06/2011 Document Revised: 01/15/2012 Document Reviewed: 07/06/2011 [employer] Patient Information 2014 [employer].  

## 2014-04-02 ENCOUNTER — Telehealth: Payer: Self-pay | Admitting: Gynecology

## 2014-04-02 NOTE — Telephone Encounter (Signed)
I called the patient with the pathology results from the LEEP. 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 The endocervical margin was close for CIN-3. The ectocervical margin was involved with CIN 1. The ECC was negative. I recommended to the patient she followup for Pap smear in 6 months and the patient agrees and understands importance to do so. She reports doing well since the procedure.

## 2014-04-06 ENCOUNTER — Telehealth: Payer: Self-pay

## 2014-04-06 NOTE — Telephone Encounter (Signed)
Had LEEP May 26. Patient said she wipes and also on pad she sees dark, black charcoal looking substance/residue and wondered if she should be concerned or if it indicated a problem?

## 2014-04-06 NOTE — Telephone Encounter (Signed)
Sounds normal for the healing process. Can last one to 2 weeks. If she has any questions or concerns I would be happy to see her.

## 2014-04-06 NOTE — Telephone Encounter (Signed)
Left message to call wk recorder.

## 2014-04-07 NOTE — Telephone Encounter (Signed)
Patient advised.

## 2014-08-21 ENCOUNTER — Other Ambulatory Visit: Payer: Self-pay

## 2014-09-07 ENCOUNTER — Encounter: Payer: Self-pay | Admitting: Gynecology

## 2014-09-17 ENCOUNTER — Encounter: Payer: Self-pay | Admitting: Gynecology

## 2014-11-16 ENCOUNTER — Telehealth: Payer: Self-pay

## 2014-11-16 NOTE — Telephone Encounter (Signed)
Patient called asking what she is coming in for on Weds appt as she now has preventative care ins policy through her work and wants to know it it will be covered. I told her she is scheduled for CE and 6 mos pap follow up.  I told her no way I can tell her if it will be covered with her plans as determination regarding payment is made by the ins co at the time the claim is received.  I told her she could check with ins co and see if they could give her an idea.  I encouraged her to bring the card and come for this visit as following up on her previous abnormal pap smear is very important.

## 2014-11-18 ENCOUNTER — Encounter: Payer: Self-pay | Admitting: Gynecology

## 2014-11-18 ENCOUNTER — Ambulatory Visit (INDEPENDENT_AMBULATORY_CARE_PROVIDER_SITE_OTHER): Payer: No Typology Code available for payment source | Admitting: Gynecology

## 2014-11-18 ENCOUNTER — Other Ambulatory Visit (HOSPITAL_COMMUNITY)
Admission: RE | Admit: 2014-11-18 | Discharge: 2014-11-18 | Disposition: A | Discharge status: Home / Self Care | Payer: No Typology Code available for payment source | Source: Ambulatory Visit | Attending: Gynecology | Admitting: Gynecology

## 2014-11-18 VITALS — BP 120/80 | Ht 67.0 in | Wt 288.0 lb

## 2014-11-18 DIAGNOSIS — Z30431 Encounter for routine checking of intrauterine contraceptive device: Secondary | ICD-10-CM

## 2014-11-18 DIAGNOSIS — Z01419 Encounter for gynecological examination (general) (routine) without abnormal findings: Secondary | ICD-10-CM

## 2014-11-18 DIAGNOSIS — Z1151 Encounter for screening for human papillomavirus (HPV): Secondary | ICD-10-CM | POA: Diagnosis present

## 2014-11-18 DIAGNOSIS — R87810 Cervical high risk human papillomavirus (HPV) DNA test positive: Secondary | ICD-10-CM | POA: Insufficient documentation

## 2014-11-18 DIAGNOSIS — Z01411 Encounter for gynecological examination (general) (routine) with abnormal findings: Secondary | ICD-10-CM | POA: Insufficient documentation

## 2014-11-18 NOTE — Patient Instructions (Signed)
Call to Schedule your mammogram  Facilities in [place]: 1)  [facility], [address] Phone: [phone] 2)  [facility]. [address] [place] Phone: [phone] 3)  Dr. Isaiah Blakes at [facility] [address] Phone: [phone]     Mammogram A mammogram is an X-ray test to find changes in a woman's breast. You should get a mammogram if:  You are 40 years of age or older  You have risk factors.   Your doctor recommends that you have one.  BEFORE THE TEST  Do not schedule the test the week before your period, especially if your breasts are sore during this time.  On the day of your mammogram:  Wash your breasts and armpits well. After washing, do not put on any deodorant or talcum powder on until after your test.   Eat and drink as you usually do.   Take your medicines as usual.   If you are diabetic and take insulin, make sure you:   Eat before coming for your test.   Take your insulin as usual.   If you cannot keep your appointment, call before the appointment to cancel. Schedule another appointment.  TEST  You will need to undress from the waist up. You will put on a hospital gown.   Your breast will be put on the mammogram machine, and it will press firmly on your breast with a piece of plastic called a compression paddle. This will make your breast flatter so that the machine can X-ray all parts of your breast.   Both breasts will be X-rayed. Each breast will be X-rayed from above and from the side. An X-ray might need to be taken again if the picture is not good enough.   The mammogram will last about 15 to 30 minutes.  AFTER THE TEST Finding out the results of your test Ask when your test results will be ready. Make sure you get your test results.  Document Released: 01/19/2009 Document Revised: 10/12/2011 Document Reviewed: 01/19/2009 [facility] Patient  Information 2012 [place].  You may obtain a copy of any labs that were done today by logging onto MyChart as outlined in the instructions provided with your AVS (after visit summary). The office will not call with normal lab results but certainly if there are any significant abnormalities then we will contact you.   Health Maintenance, Female A healthy lifestyle and preventative care can promote health and wellness.  Maintain regular health, dental, and eye exams.  Eat a healthy diet. Foods like vegetables, fruits, whole grains, low-fat dairy products, and lean protein foods contain the nutrients you need without too many calories. Decrease your intake of foods high in solid fats, added sugars, and salt. Get information about a proper diet from your caregiver, if necessary.  Regular physical exercise is one of the most important things you can do for your health. Most adults should get at least 150 minutes of moderate-intensity exercise (any activity that increases your heart rate and causes you to sweat) each week. In addition, most adults need muscle-strengthening exercises on 2 or more days a week.   Maintain a healthy weight. The body mass index (BMI) is a screening tool to identify possible weight problems. It provides an estimate of body fat based on height and weight. Your caregiver can help determine your BMI, and can help you achieve or maintain a healthy weight. For adults  20 years and older:  A BMI below 18.5 is considered underweight.  A BMI of 18.5 to 24.9 is normal.  A BMI of 25 to 29.9 is considered overweight.  A BMI of 30 and above is considered obese.  Maintain normal blood lipids and cholesterol by exercising and minimizing your intake of saturated fat. Eat a balanced diet with plenty of fruits and vegetables. Blood tests for lipids and cholesterol should begin at age 20 and be repeated every 5 years. If your lipid or cholesterol levels are high, you are over 50, or  you are a high risk for heart disease, you may need your cholesterol levels checked more frequently.Ongoing high lipid and cholesterol levels should be treated with medicines if diet and exercise are not effective.  If you smoke, find out from your caregiver how to quit. If you do not use tobacco, do not start.  Lung cancer screening is recommended for adults aged 55 80 years who are at high risk for developing lung cancer because of a history of smoking. Yearly low-dose computed tomography (CT) is recommended for people who have at least a 30-pack-year history of smoking and are a current smoker or have quit within the past 15 years. A pack year of smoking is smoking an average of 1 pack of cigarettes a day for 1 year (for example: 1 pack a day for 30 years or 2 packs a day for 15 years). Yearly screening should continue until the smoker has stopped smoking for at least 15 years. Yearly screening should also be stopped for people who develop a health problem that would prevent them from having lung cancer treatment.  If you are pregnant, do not drink alcohol. If you are breastfeeding, be very cautious about drinking alcohol. If you are not pregnant and choose to drink alcohol, do not exceed 1 drink per day. One drink is considered to be 12 ounces (355 mL) of beer, 5 ounces (148 mL) of wine, or 1.5 ounces (44 mL) of liquor.  Avoid use of street drugs. Do not share needles with anyone. Ask for help if you need support or instructions about stopping the use of drugs.  High blood pressure causes heart disease and increases the risk of stroke. Blood pressure should be checked at least every 1 to 2 years. Ongoing high blood pressure should be treated with medicines, if weight loss and exercise are not effective.  If you are 55 to 40 years old, ask your caregiver if you should take aspirin to prevent strokes.  Diabetes screening involves taking a blood sample to check your fasting blood sugar level. This  should be done once every 3 years, after age 45, if you are within normal weight and without risk factors for diabetes. Testing should be considered at a younger age or be carried out more frequently if you are overweight and have at least 1 risk factor for diabetes.  Breast cancer screening is essential preventative care for women. You should practice "breast self-awareness." This means understanding the normal appearance and feel of your breasts and may include breast self-examination. Any changes detected, no matter how small, should be reported to a caregiver. Women in their 20s and 30s should have a clinical breast exam (CBE) by a caregiver as part of a regular health exam every 1 to 3 years. After age 40, women should have a CBE every year. Starting at age 40, women should consider having a mammogram (breast X-ray) every year. Women who have a   family history of breast cancer should talk to their caregiver about genetic screening. Women at a high risk of breast cancer should talk to their caregiver about having an MRI and a mammogram every year.  Breast cancer gene (BRCA)-related cancer risk assessment is recommended for women who have family members with BRCA-related cancers. BRCA-related cancers include breast, ovarian, tubal, and peritoneal cancers. Having family members with these cancers may be associated with an increased risk for harmful changes (mutations) in the breast cancer genes BRCA1 and BRCA2. Results of the assessment will determine the need for genetic counseling and BRCA1 and BRCA2 testing.  The Pap test is a screening test for cervical cancer. Women should have a Pap test starting at age 33. Between ages 75 and 14, Pap tests should be repeated every 2 years. Beginning at age 4, you should have a Pap test every 3 years as long as the past 3 Pap tests have been normal. If you had a hysterectomy for a problem that was not cancer or a condition that could lead to cancer, then you no longer  need Pap tests. If you are between ages 72 and 12, and you have had normal Pap tests going back 10 years, you no longer need Pap tests. If you have had past treatment for cervical cancer or a condition that could lead to cancer, you need Pap tests and screening for cancer for at least 20 years after your treatment. If Pap tests have been discontinued, risk factors (such as a new sexual partner) need to be reassessed to determine if screening should be resumed. Some women have medical problems that increase the chance of getting cervical cancer. In these cases, your caregiver may recommend more frequent screening and Pap tests.  The human papillomavirus (HPV) test is an additional test that may be used for cervical cancer screening. The HPV test looks for the virus that can cause the cell changes on the cervix. The cells collected during the Pap test can be tested for HPV. The HPV test could be used to screen women aged 84 years and older, and should be used in women of any age who have unclear Pap test results. After the age of 73, women should have HPV testing at the same frequency as a Pap test.  Colorectal cancer can be detected and often prevented. Most routine colorectal cancer screening begins at the age of 56 and continues through age 76. However, your caregiver may recommend screening at an earlier age if you have risk factors for colon cancer. On a yearly basis, your caregiver may provide home test kits to check for hidden blood in the stool. Use of a small camera at the end of a tube, to directly examine the colon (sigmoidoscopy or colonoscopy), can detect the earliest forms of colorectal cancer. Talk to your caregiver about this at age 20, when routine screening begins. Direct examination of the colon should be repeated every 5 to 10 years through age 83, unless early forms of pre-cancerous polyps or small growths are found.  Hepatitis C blood testing is recommended for all people born from [birth year]  through 1965 and any individual with known risks for hepatitis C.  Practice safe sex. Use condoms and avoid high-risk sexual practices to reduce the spread of sexually transmitted infections (STIs). Sexually active women aged 28 and younger should be checked for Chlamydia, which is a common sexually transmitted infection. Older women with new or multiple partners should also be tested for Chlamydia. Testing for  other STIs is recommended if you are sexually active and at increased risk.  Osteoporosis is a disease in which the bones lose minerals and strength with aging. This can result in serious bone fractures. The risk of osteoporosis can be identified using a bone density scan. Women ages 35 and over and women at risk for fractures or osteoporosis should discuss screening with their caregivers. Ask your caregiver whether you should be taking a calcium supplement or vitamin D to reduce the rate of osteoporosis.  Menopause can be associated with physical symptoms and risks. Hormone replacement therapy is available to decrease symptoms and risks. You should talk to your caregiver about whether hormone replacement therapy is right for you.  Use sunscreen. Apply sunscreen liberally and repeatedly throughout the day. You should seek shade when your shadow is shorter than you. Protect yourself by wearing long sleeves, pants, a wide-brimmed hat, and sunglasses year round, whenever you are outdoors.  Notify your caregiver of new moles or changes in moles, especially if there is a change in shape or color. Also notify your caregiver if a mole is larger than the size of a pencil eraser.  Stay current with your immunizations. Document Released: 05/08/2011 Document Revised: 02/17/2013 Document Reviewed: 05/08/2011 [facility] Patient Information 2014 [place].

## 2014-11-18 NOTE — Addendum Note (Signed)
Addended by: Dayna BarkerGARDNER, Jasnoor Trussell K on: 11/18/2014 03:21 PM   Modules accepted: Orders, SmartSet

## 2014-11-18 NOTE — Progress Notes (Signed)
Patricia Marks 06/22/1976 [id number]        39 y.o.  G1P0010 for annual exam.  Several issues noted below.  Past medical history,surgical history, problem list, medications, allergies, family history and social history were all reviewed and documented as reviewed in the EPIC chart.  ROS:  Performed with pertinent positives and negatives included in the history, assessment and plan.   Additional significant findings :  none   Exam: Patricia [facility] personassistant Filed Vitals:   11/18/14 1415  BP: 120/80  Height: 5\' 7"  (1.702 m)  Weight: 288 lb (130.636 kg)   General appearance:  Normal affect, orientation and appearance. Skin: Grossly normal HEENT: Without gross lesions.  No cervical or supraclavicular adenopathy. Thyroid normal.  Lungs:  Clear without wheezing, rales or rhonchi Cardiac: RR, without RMG Abdominal:  Soft, nontender, without masses, guarding, rebound, organomegaly or hernia Breasts:  Examined lying and sitting without masses, retractions, discharge or axillary adenopathy.  Old scarring left anterior chest/breast. Pelvic:  Ext/BUS/vagina normal  Cervix normal with IUD strings visualized. Pap/HPV  Uterus anteverted, normal size, shape and contour, midline and mobile nontender   Adnexa  Without masses or tenderness    Anus and perineum  Normal   Rectovaginal  Normal sphincter tone without palpated masses or tenderness.    Assessment/Plan:  39 y.o. 681P0010 female for annual exam irregular menses, Mirena IUD.   1. Mirena IUD 10/2013. Initially was having regular menses and now does not have menses but will note some spotting during the month. Also some cramping. Reviewed options with her at this point we both agree on monitoring and hopefully this will resolve over time. IUD strings visualized. Will call if it continues to be an issue. 2. HGSIL.  History of Pap smear 10/2013 with normal cytology but positive high-risk HPV. 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 Patient elected for expectant management with repeat colposcopy 02/2014 positive HGSIL ECC. Subsequently underwent LEEP 03/2014 with findings of high-grade and low-grade dysplasia including involving glands. Ectocervical margin involved with low-grade dysplasia. Endocervical margin close for HGSIL but did not appear involved at inked margin. Pap smear done today with HPV. Triage based upon results. 3. Mammography never. Recommend patient schedule now she is age 39. Patient agrees to do so and names and numbers provided. SBE monthly reviewed. 4. Health maintenance. She reports full routine blood work done through her primary physician's office. Follow up for Pap smear results otherwise annually for exam.     Patricia Marks,Patricia Stemmler P MD, 2:46 PM 11/18/2014

## 2014-11-20 LAB — CYTOLOGY - PAP

## 2014-11-23 ENCOUNTER — Encounter: Payer: Self-pay | Admitting: Gynecology

## 2015-06-04 ENCOUNTER — Encounter: Payer: Self-pay | Admitting: Gynecology

## 2015-06-04 ENCOUNTER — Ambulatory Visit (INDEPENDENT_AMBULATORY_CARE_PROVIDER_SITE_OTHER): Payer: No Typology Code available for payment source | Admitting: Gynecology

## 2015-06-04 VITALS — BP 124/80

## 2015-06-04 DIAGNOSIS — T8389XA Other specified complication of genitourinary prosthetic devices, implants and grafts, initial encounter: Secondary | ICD-10-CM

## 2015-06-04 DIAGNOSIS — R102 Pelvic and perineal pain: Secondary | ICD-10-CM

## 2015-06-04 DIAGNOSIS — Z30432 Encounter for removal of intrauterine contraceptive device: Secondary | ICD-10-CM

## 2015-06-04 MED ORDER — NORETHINDRONE ACET-ETHINYL EST 1-20 MG-MCG PO TABS
1.0000 | ORAL_TABLET | Freq: Every day | ORAL | Status: DC
Start: 2015-06-04 — End: 2016-06-09

## 2015-06-04 NOTE — Progress Notes (Signed)
Patricia Marks [birth year] [id number]        39 y.o.  G1P0010 Presents to have her IUD removed. Patient has been having a lot of pelvic cramping on and off over the past year and seems to be getting worse.  She has thought about this and wants to have her IUD removed.  Past medical history,surgical history, problem list, medications, allergies, family history and social history were all reviewed and documented in the EPIC chart.  Directed ROS with pertinent positives and negatives documented in the history of present illness/assessment and plan.  Exam: [name] assistant Filed Vitals:   06/04/15 1402  BP: 124/80   General appearance:  Normal Abdomen soft nontender without masses guarding rebound Pelvic external BUS vagina normal. Cervix normal with IUD string visualized. Uterus grossly normal midline mobile nontender. Adnexa without masses or tenderness.  Procedure: IUD string was visualized, grasped with the [facility] forcep and her Mirena IUD was removed, shown to the patient and discarded. Patient tolerated well.  Assessment/Plan:  39 y.o. G1P0010 with cramping attributed to her IUD. Patient will follow up if cramping continues for more involved evaluation. Assuming her results and will follow. Discussed birth control options with her to include pill patch ring Depo-Provera Nexplanon different IUD sterilization. Patient interested in trying the pill. She does have high blood pressure but it is well controlled. Possible increased risk of stroke heart attack DVT associated with this. Never smoked. Patient understands and accepts the risks and Loestrin 120 equipment prescribed and she will start this Sunday. Backup contraception through 1-2 packs. Follow up if any issues with this. Otherwise follow up in January 2017 when she is due for her annual exam.    Dara Lords MD, 2:17 PM 06/04/2015

## 2015-06-04 NOTE — Patient Instructions (Signed)
Start on the birth control pills as we discussed. Call me if any issues with this

## 2015-11-23 ENCOUNTER — Encounter: Payer: No Typology Code available for payment source | Admitting: Gynecology

## 2016-04-25 ENCOUNTER — Other Ambulatory Visit: Payer: Self-pay | Admitting: Gynecology

## 2016-04-25 DIAGNOSIS — Z139 Encounter for screening, unspecified: Secondary | ICD-10-CM

## 2016-05-10 ENCOUNTER — Ambulatory Visit: Payer: No Typology Code available for payment source

## 2016-05-11 ENCOUNTER — Ambulatory Visit
Admission: RE | Admit: 2016-05-11 | Discharge: 2016-05-11 | Disposition: A | Discharge status: Home / Self Care | Payer: BC Managed Care – PPO | Source: Ambulatory Visit | Attending: Gynecology | Admitting: Gynecology

## 2016-05-11 DIAGNOSIS — Z139 Encounter for screening, unspecified: Secondary | ICD-10-CM

## 2016-05-12 ENCOUNTER — Encounter: Payer: Self-pay | Admitting: Gynecology

## 2016-05-16 ENCOUNTER — Ambulatory Visit (INDEPENDENT_AMBULATORY_CARE_PROVIDER_SITE_OTHER): Payer: BC Managed Care – PPO | Admitting: Gynecology

## 2016-05-16 ENCOUNTER — Encounter: Payer: Self-pay | Admitting: Gynecology

## 2016-05-16 VITALS — BP 130/84 | Ht 66.5 in | Wt 300.0 lb

## 2016-05-16 DIAGNOSIS — R87810 Cervical high risk human papillomavirus (HPV) DNA test positive: Secondary | ICD-10-CM | POA: Diagnosis not present

## 2016-05-16 DIAGNOSIS — N926 Irregular menstruation, unspecified: Secondary | ICD-10-CM

## 2016-05-16 DIAGNOSIS — Z01419 Encounter for gynecological examination (general) (routine) without abnormal findings: Secondary | ICD-10-CM

## 2016-05-16 NOTE — Addendum Note (Signed)
Addended by: Dayna BarkerGARDNER, Sophia Cubero K on: 05/16/2016 01:46 PM   Modules accepted: Orders, SmartSet

## 2016-05-16 NOTE — Patient Instructions (Signed)
Follow-up for the ultrasound as scheduled. 
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
## 2016-05-18 ENCOUNTER — Encounter: Payer: Self-pay | Admitting: Gynecology

## 2016-05-18 LAB — PAP IG AND HPV HIGH-RISK: HPV DNA HIGH RISK: NOT DETECTED

## 2016-05-25 ENCOUNTER — Other Ambulatory Visit: Payer: Self-pay | Admitting: Gynecology

## 2016-05-25 DIAGNOSIS — N939 Abnormal uterine and vaginal bleeding, unspecified: Secondary | ICD-10-CM

## 2016-05-30 ENCOUNTER — Telehealth: Payer: Self-pay | Admitting: Gynecology

## 2016-05-30 NOTE — Telephone Encounter (Signed)
05/30/16-Pt was informed today that her [facility] plan will cover the sonohysterogram and bx if needed with her $40 copay. Per Isabelle@BC [phone]

## 2016-06-02 ENCOUNTER — Other Ambulatory Visit: Payer: BC Managed Care – PPO

## 2016-06-02 LAB — CBC WITH DIFFERENTIAL/PLATELET
Basophils Absolute: 0 cells/uL (ref 0–200)
Basophils Relative: 0 %
Eosinophils Absolute: 99 cells/uL (ref 15–500)
Eosinophils Relative: 1 %
HCT: 39.3 % (ref 35.0–45.0)
Hemoglobin: 12.4 g/dL (ref 11.7–15.5)
LYMPHS PCT: 32 %
Lymphs Abs: 3168 cells/uL (ref 850–3900)
MCH: 26 pg — AB (ref 27.0–33.0)
MCHC: 31.6 g/dL — ABNORMAL LOW (ref 32.0–36.0)
MCV: 82.4 fL (ref 80.0–100.0)
MONOS PCT: 7 %
MPV: 10.5 fL (ref 7.5–12.5)
Monocytes Absolute: 693 cells/uL (ref 200–950)
NEUTROS ABS: 5940 {cells}/uL (ref 1500–7800)
Neutrophils Relative %: 60 %
PLATELETS: 352 10*3/uL (ref 140–400)
RBC: 4.77 MIL/uL (ref 3.80–5.10)
RDW: 15.2 % — AB (ref 11.0–15.0)
WBC: 9.9 10*3/uL (ref 3.8–10.8)

## 2016-06-02 LAB — TSH: TSH: 1.01 m[IU]/L

## 2016-06-03 LAB — PROLACTIN: Prolactin: 5.8 ng/mL

## 2016-06-03 LAB — FOLLICLE STIMULATING HORMONE: FSH: 5.6 m[IU]/mL

## 2016-06-03 LAB — HCG, SERUM, QUALITATIVE: Preg, Serum: NEGATIVE

## 2016-06-07 ENCOUNTER — Encounter: Payer: Self-pay | Admitting: Gynecology

## 2016-06-07 ENCOUNTER — Ambulatory Visit (INDEPENDENT_AMBULATORY_CARE_PROVIDER_SITE_OTHER): Payer: BC Managed Care – PPO

## 2016-06-07 ENCOUNTER — Ambulatory Visit (INDEPENDENT_AMBULATORY_CARE_PROVIDER_SITE_OTHER): Payer: BC Managed Care – PPO | Admitting: Gynecology

## 2016-06-07 ENCOUNTER — Other Ambulatory Visit: Payer: Self-pay | Admitting: Gynecology

## 2016-06-07 VITALS — BP 122/82

## 2016-06-07 DIAGNOSIS — N926 Irregular menstruation, unspecified: Secondary | ICD-10-CM

## 2016-06-07 DIAGNOSIS — N939 Abnormal uterine and vaginal bleeding, unspecified: Secondary | ICD-10-CM | POA: Diagnosis not present

## 2016-06-07 DIAGNOSIS — R9389 Abnormal findings on diagnostic imaging of other specified body structures: Secondary | ICD-10-CM

## 2016-06-07 DIAGNOSIS — R938 Abnormal findings on diagnostic imaging of other specified body structures: Secondary | ICD-10-CM

## 2016-06-07 MED ORDER — MEDROXYPROGESTERONE ACETATE 10 MG PO TABS: 10.0000 mg | ORAL_TABLET | Freq: Every day | ORAL | 0 refills | Status: DC

## 2016-06-07 NOTE — Patient Instructions (Signed)
Start the progesterone medication daily for 2 weeks as we discussed.  Office will call you with the biopsy results

## 2016-06-07 NOTE — Progress Notes (Signed)
    Patricia Marks Apr 13, 1976 [id number]        40 y.o.  G1P0010 presents for sonohysterogram. History of Mirena IUD removed 1 year ago due to cramping. Had some amenorrhea afterwards but then resumed regular monthly menses up until beginning of this year when she started having more irregular menses and then she has progressed bleeding on a daily basis. No pain or other symptoms. Recent lab work to include [facility] TSH prolactin hCG were all negative.  Past medical history,surgical history, problem list, medications, allergies, family history and social history were all reviewed and documented in the EPIC chart.  Directed ROS with pertinent positives and negatives documented in the history of present illness/assessment and plan.  Exam: [place] assistant Vitals:   06/07/16 1221  BP: 122/82   General appearance:  Normal Abdomen soft nontender without masses guarding rebound Pelvic external BUS vagina with light bleeding. Cervix normal. Uterus difficult to palpate but grossly normal in size midline mobile nontender. Adnexa without gross masses or tenderness.  Ultrasound shows uterus to be normal size and echotexture. Endometrial echo 10 mm. Right ovary normal. Left ovary normal. Numerous small follicles noted bilaterally. Fluid in cul-de-sac 17 x 8 mm.  Sonohysterogram performed, sterile technique, easy catheter introduction with suboptimal distention but no abnormality seen. Endometrial biopsy taken.    Assessment/Plan:  40 y.o. G1P0010 with irregular bleeding after removing Mirena IUD. Hormonal studies negative. Ultrasound grossly normal with somewhat thicker endometrial echo. Suboptimal sonohysterogram but no gross abnormality seen. Endometrial sample taken. Patient will follow up for biopsy results. Plan Provera 10 mg 14 day withdrawal. Options to start oral contraceptives for contraception/menstrual regulation assuming biopsy appropriate versus observation using barrier contraception  discussed. Patient wants to think of her options and will follow up for biopsy results.    Dara Lords MD, 12:31 PM 06/07/2016

## 2016-06-08 ENCOUNTER — Encounter: Payer: No Typology Code available for payment source | Admitting: Gynecology

## 2016-06-09 ENCOUNTER — Other Ambulatory Visit: Payer: Self-pay | Admitting: Gynecology

## 2016-06-09 MED ORDER — NORETHINDRONE ACET-ETHINYL EST 1-20 MG-MCG PO TABS: 1.0000 | ORAL_TABLET | Freq: Every day | ORAL | 4 refills | Status: DC

## 2016-06-22 ENCOUNTER — Other Ambulatory Visit: Payer: BC Managed Care – PPO

## 2016-06-22 ENCOUNTER — Ambulatory Visit: Payer: BC Managed Care – PPO | Admitting: Gynecology

## 2016-06-29 ENCOUNTER — Telehealth: Payer: Self-pay | Admitting: *Deleted

## 2016-06-29 MED ORDER — MEGESTROL ACETATE 20 MG PO TABS: ORAL_TABLET | ORAL | 0 refills | Status: DC

## 2016-06-29 NOTE — Telephone Encounter (Signed)
Pt informed with the below note, Rx sent. 

## 2016-06-29 NOTE — Telephone Encounter (Signed)
Stay on pills for now.  Add Megace 20 mg BID as needed for heavy bleeding days # 30.  Give it a few months.  Follow up if continues

## 2016-06-29 NOTE — Telephone Encounter (Signed)
(  pt aware you are out of the office) Patient called c/o increased bleeding and passing clots with birth control pills, started pills this month,history of irregular bleeding. States she has to change pads 4 times a day, not heavy, prior to starting pills spotting only, I did explain to pt not abnormal to breakthrough bleeding when starting new birth control pills as her body has to adjust and we tell patients to give it at least 3 months to regulate.  Pt states she very frustrated and just wants her body to "get back to normal" I told her you prescribed pills for contraception/menstrual regulation. Pt states contraception is not her priority at this point now, as in the further she would like to have a child. She is willing to stay on pills for now, but doesn't want this as long term. She wanted to know your thoughts if you think continuing pills and monitoring bleeding?   Please advise

## 2016-08-03 ENCOUNTER — Other Ambulatory Visit: Payer: Self-pay | Admitting: Gynecology

## 2016-08-03 NOTE — Telephone Encounter (Signed)
Okay to refill Megace 20 mg twice a day as needed for bleeding #30 no refill

## 2016-08-03 NOTE — Telephone Encounter (Signed)
Pt requesting refill on Rx was generated on 06/29/16 telephone encounter.  Please advise

## 2016-08-03 NOTE — Telephone Encounter (Signed)
Rx sent 

## 2019-06-13 ENCOUNTER — Ambulatory Visit: Payer: Self-pay | Admitting: General Surgery

## 2019-07-21 ENCOUNTER — Other Ambulatory Visit: Payer: Self-pay

## 2019-07-21 ENCOUNTER — Encounter (HOSPITAL_COMMUNITY)
Admission: RE | Admit: 2019-07-21 | Discharge: 2019-07-21 | Disposition: A | Discharge status: Home / Self Care | Payer: BC Managed Care – PPO | Source: Ambulatory Visit | Attending: General Surgery | Admitting: General Surgery

## 2019-07-21 ENCOUNTER — Other Ambulatory Visit (HOSPITAL_COMMUNITY)
Admission: RE | Admit: 2019-07-21 | Discharge: 2019-07-21 | Disposition: A | Discharge status: Home / Self Care | Payer: BC Managed Care – PPO | Source: Ambulatory Visit | Attending: General Surgery | Admitting: General Surgery

## 2019-07-21 DIAGNOSIS — I10 Essential (primary) hypertension: Secondary | ICD-10-CM | POA: Diagnosis not present

## 2019-07-21 DIAGNOSIS — Z6841 Body Mass Index (BMI) 40.0 and over, adult: Secondary | ICD-10-CM | POA: Diagnosis not present

## 2019-07-21 DIAGNOSIS — Z01818 Encounter for other preprocedural examination: Secondary | ICD-10-CM | POA: Insufficient documentation

## 2019-07-21 DIAGNOSIS — L0591 Pilonidal cyst without abscess: Secondary | ICD-10-CM | POA: Diagnosis present

## 2019-07-21 DIAGNOSIS — R000 Tachycardia, unspecified: Secondary | ICD-10-CM | POA: Diagnosis not present

## 2019-07-21 DIAGNOSIS — Z79899 Other long term (current) drug therapy: Secondary | ICD-10-CM | POA: Diagnosis not present

## 2019-07-21 DIAGNOSIS — Z01812 Encounter for preprocedural laboratory examination: Secondary | ICD-10-CM | POA: Insufficient documentation

## 2019-07-21 DIAGNOSIS — Z20828 Contact with and (suspected) exposure to other viral communicable diseases: Secondary | ICD-10-CM | POA: Diagnosis not present

## 2019-07-21 DIAGNOSIS — K219 Gastro-esophageal reflux disease without esophagitis: Secondary | ICD-10-CM | POA: Diagnosis not present

## 2019-07-21 LAB — BASIC METABOLIC PANEL
Anion gap: 10 (ref 5–15)
BUN: 9 mg/dL (ref 6–20)
CO2: 27 mmol/L (ref 22–32)
Calcium: 9.2 mg/dL (ref 8.9–10.3)
Chloride: 102 mmol/L (ref 98–111)
Creatinine, Ser: 1.13 mg/dL — ABNORMAL HIGH (ref 0.44–1.00)
GFR calc Af Amer: >60 mL/min (ref >60)
GFR calc non Af Amer: 59 mL/min — ABNORMAL LOW (ref >60)
Glucose, Bld: 125 mg/dL — ABNORMAL HIGH (ref 70–99)
Potassium: 4.2 mmol/L (ref 3.5–5.1)
Sodium: 139 mmol/L (ref 135–145)

## 2019-07-21 LAB — CBC
HCT: 43.6 % (ref 36.0–46.0)
Hemoglobin: 13.4 g/dL (ref 12.0–15.0)
MCH: 26 pg (ref 26.0–34.0)
MCHC: 30.7 g/dL (ref 30.0–36.0)
MCV: 84.5 fL (ref 80.0–100.0)
Platelets: 406 10*3/uL — ABNORMAL HIGH (ref 150–400)
RBC: 5.16 MIL/uL — ABNORMAL HIGH (ref 3.87–5.11)
RDW: 14.8 % (ref 11.5–15.5)
WBC: 9.1 10*3/uL (ref 4.0–10.5)
nRBC: 0 % (ref 0.0–0.2)

## 2019-07-21 MED ORDER — ENSURE PRE-SURGERY PO LIQD
296.0000 mL | Freq: Once | ORAL | Status: DC
  Filled 2019-07-21: qty 296

## 2019-07-22 ENCOUNTER — Other Ambulatory Visit: Payer: Self-pay

## 2019-07-22 ENCOUNTER — Encounter (HOSPITAL_BASED_OUTPATIENT_CLINIC_OR_DEPARTMENT_OTHER): Payer: Self-pay | Admitting: *Deleted

## 2019-07-22 LAB — NOVEL CORONAVIRUS, NAA (HOSP ORDER, SEND-OUT TO REF LAB; TAT 18-24 HRS): SARS-CoV-2, NAA: NOT DETECTED

## 2019-07-22 NOTE — Progress Notes (Addendum)
Spoke w/ pt via phone for pre-op interview.  Npo after mn w/ exception clear liquids until 0845 at which time to complete ensure pre-surgery drink then nothing by mouth, pt verbalized understanding (pt picked up drink and handout instruction at lab appt).  Needs urine preg.  Current lab results 07-21-2019 (cbc, bmp, ekg) in chart and epic.  Will take norvasc and prilosec am dos w/ sips of water.   PCP -   Cardiologist -  no  Chest x-ray -  no EKG -  07-21-2019 epic Stress Test -  no ECHO -  no Cardiac Cath -  no  Sleep Study -  no CPAP -   Fasting Blood Sugar -  n/A Checks Blood Sugar _____ times a day  Blood Thinner Instructions: no Aspirin Instructions: no Last Dose:  Anesthesia review:  Pt BMI 50.84 (5'6" 315 lb). Pt denies any cardiac s&s, sob.  Pt appointment for anesthesia evaluation made for 07-23-2019 @ 1300 as per anesthesia guidelines for ambulatory surgery center.  Patient denies shortness of breath, fever, cough and chest pain at phone interview.  ADDENDUM:  Pt came in for her PAT appt. To be evaluated by anesthesia, dr r. Ola Spurr, stated ok to proceed.

## 2019-07-23 ENCOUNTER — Encounter (HOSPITAL_COMMUNITY)
Admission: RE | Admit: 2019-07-23 | Discharge: 2019-07-23 | Disposition: A | Discharge status: Home / Self Care | Payer: BC Managed Care – PPO | Source: Ambulatory Visit | Attending: General Surgery | Admitting: General Surgery

## 2019-07-23 MED ORDER — DEXTROSE 5 % IV SOLN
3.0000 g | INTRAVENOUS | Status: AC
  Administered 2019-07-24: 11:00:00 3 g via INTRAVENOUS
  Filled 2019-07-23: qty 3000

## 2019-07-24 ENCOUNTER — Encounter (HOSPITAL_BASED_OUTPATIENT_CLINIC_OR_DEPARTMENT_OTHER): Payer: Self-pay | Admitting: Emergency Medicine

## 2019-07-24 ENCOUNTER — Encounter (HOSPITAL_BASED_OUTPATIENT_CLINIC_OR_DEPARTMENT_OTHER)
Admission: RE | Disposition: A | Discharge status: Home / Self Care | Payer: Self-pay | Source: Ambulatory Visit | Attending: General Surgery

## 2019-07-24 ENCOUNTER — Ambulatory Visit (HOSPITAL_BASED_OUTPATIENT_CLINIC_OR_DEPARTMENT_OTHER)
Admission: RE | Admit: 2019-07-24 | Discharge: 2019-07-24 | Disposition: A | Discharge status: Home / Self Care | Payer: BC Managed Care – PPO | Source: Ambulatory Visit | Attending: General Surgery | Admitting: General Surgery

## 2019-07-24 ENCOUNTER — Ambulatory Visit (HOSPITAL_BASED_OUTPATIENT_CLINIC_OR_DEPARTMENT_OTHER): Payer: BC Managed Care – PPO | Admitting: Anesthesiology

## 2019-07-24 ENCOUNTER — Other Ambulatory Visit: Payer: Self-pay

## 2019-07-24 DIAGNOSIS — L0591 Pilonidal cyst without abscess: Secondary | ICD-10-CM | POA: Diagnosis not present

## 2019-07-24 DIAGNOSIS — K219 Gastro-esophageal reflux disease without esophagitis: Secondary | ICD-10-CM | POA: Insufficient documentation

## 2019-07-24 DIAGNOSIS — Z6841 Body Mass Index (BMI) 40.0 and over, adult: Secondary | ICD-10-CM | POA: Insufficient documentation

## 2019-07-24 DIAGNOSIS — I10 Essential (primary) hypertension: Secondary | ICD-10-CM | POA: Insufficient documentation

## 2019-07-24 DIAGNOSIS — Z79899 Other long term (current) drug therapy: Secondary | ICD-10-CM | POA: Insufficient documentation

## 2019-07-24 HISTORY — DX: Pilonidal sinus without abscess: L05.92

## 2019-07-24 HISTORY — DX: Gastro-esophageal reflux disease without esophagitis: K21.9

## 2019-07-24 HISTORY — DX: Presence of spectacles and contact lenses: Z97.3

## 2019-07-24 HISTORY — DX: Personal history of diseases of the blood and blood-forming organs and certain disorders involving the immune mechanism: Z86.2

## 2019-07-24 HISTORY — DX: Personal history of cervical dysplasia: Z87.410

## 2019-07-24 HISTORY — PX: PILONIDAL CYST EXCISION: SHX744

## 2019-07-24 HISTORY — DX: Personal history of other specified conditions: Z87.898

## 2019-07-24 LAB — POCT PREGNANCY, URINE: Preg Test, Ur: NEGATIVE

## 2019-07-24 SURGERY — EXCISION, SIMPLE PILONIDAL CYST
Anesthesia: General | Site: Back

## 2019-07-24 MED ORDER — DEXAMETHASONE SODIUM PHOSPHATE 10 MG/ML IJ SOLN
INTRAMUSCULAR | Status: DC | PRN
  Administered 2019-07-24: 5 mg via INTRAVENOUS

## 2019-07-24 MED ORDER — CEFAZOLIN SODIUM-DEXTROSE 2-4 GM/100ML-% IV SOLN
INTRAVENOUS | Status: AC
  Filled 2019-07-24: qty 100

## 2019-07-24 MED ORDER — SUGAMMADEX SODIUM 500 MG/5ML IV SOLN
INTRAVENOUS | Status: DC | PRN
  Administered 2019-07-24: 350 mg via INTRAVENOUS

## 2019-07-24 MED ORDER — BUPIVACAINE HCL 0.5 % IJ SOLN
INTRAMUSCULAR | Status: DC | PRN
  Administered 2019-07-24: 30 mL

## 2019-07-24 MED ORDER — LIDOCAINE 2% (20 MG/ML) 5 ML SYRINGE
INTRAMUSCULAR | Status: AC
  Filled 2019-07-24: qty 5

## 2019-07-24 MED ORDER — DEXAMETHASONE SODIUM PHOSPHATE 10 MG/ML IJ SOLN
INTRAMUSCULAR | Status: AC
  Filled 2019-07-24: qty 1

## 2019-07-24 MED ORDER — MIDAZOLAM HCL 2 MG/2ML IJ SOLN
INTRAMUSCULAR | Status: DC | PRN
  Administered 2019-07-24: 2 mg via INTRAVENOUS

## 2019-07-24 MED ORDER — PHENYLEPHRINE 40 MCG/ML (10ML) SYRINGE FOR IV PUSH (FOR BLOOD PRESSURE SUPPORT)
PREFILLED_SYRINGE | INTRAVENOUS | Status: AC
  Filled 2019-07-24: qty 10

## 2019-07-24 MED ORDER — FENTANYL CITRATE (PF) 100 MCG/2ML IJ SOLN
INTRAMUSCULAR | Status: AC
  Filled 2019-07-24: qty 2

## 2019-07-24 MED ORDER — PROPOFOL 10 MG/ML IV BOLUS
INTRAVENOUS | Status: AC
  Filled 2019-07-24: qty 40

## 2019-07-24 MED ORDER — SUCCINYLCHOLINE CHLORIDE 200 MG/10ML IV SOSY
PREFILLED_SYRINGE | INTRAVENOUS | Status: AC
  Filled 2019-07-24: qty 10

## 2019-07-24 MED ORDER — BUPIVACAINE LIPOSOME 1.3 % IJ SUSP
INTRAMUSCULAR | Status: DC | PRN
  Administered 2019-07-24: 20 mL

## 2019-07-24 MED ORDER — PROPOFOL 10 MG/ML IV BOLUS
INTRAVENOUS | Status: DC | PRN
  Administered 2019-07-24: 250 mg via INTRAVENOUS

## 2019-07-24 MED ORDER — CHLORHEXIDINE GLUCONATE CLOTH 2 % EX PADS
6.0000 | MEDICATED_PAD | Freq: Once | CUTANEOUS | Status: DC
  Filled 2019-07-24: qty 6

## 2019-07-24 MED ORDER — SUGAMMADEX SODIUM 500 MG/5ML IV SOLN
INTRAVENOUS | Status: AC
  Filled 2019-07-24: qty 5

## 2019-07-24 MED ORDER — ROCURONIUM BROMIDE 10 MG/ML (PF) SYRINGE
PREFILLED_SYRINGE | INTRAVENOUS | Status: AC
  Filled 2019-07-24: qty 10

## 2019-07-24 MED ORDER — LIDOCAINE 5 % EX OINT
TOPICAL_OINTMENT | CUTANEOUS | Status: DC | PRN
  Administered 2019-07-24: 1

## 2019-07-24 MED ORDER — ONDANSETRON HCL 4 MG/2ML IJ SOLN
INTRAMUSCULAR | Status: AC
  Filled 2019-07-24: qty 2

## 2019-07-24 MED ORDER — FENTANYL CITRATE (PF) 100 MCG/2ML IJ SOLN
INTRAMUSCULAR | Status: DC | PRN
  Administered 2019-07-24 (×2): 50 ug via INTRAVENOUS

## 2019-07-24 MED ORDER — PHENYLEPHRINE 40 MCG/ML (10ML) SYRINGE FOR IV PUSH (FOR BLOOD PRESSURE SUPPORT)
PREFILLED_SYRINGE | INTRAVENOUS | Status: DC | PRN
  Administered 2019-07-24: 80 ug via INTRAVENOUS

## 2019-07-24 MED ORDER — MIDAZOLAM HCL 2 MG/2ML IJ SOLN
INTRAMUSCULAR | Status: AC
  Filled 2019-07-24: qty 2

## 2019-07-24 MED ORDER — ONDANSETRON HCL 4 MG/2ML IJ SOLN
INTRAMUSCULAR | Status: DC | PRN
  Administered 2019-07-24: 4 mg via INTRAVENOUS

## 2019-07-24 MED ORDER — OXYCODONE HCL 5 MG PO TABS: 5.0000 mg | ORAL_TABLET | Freq: Four times a day (QID) | ORAL | 0 refills | Status: DC | PRN

## 2019-07-24 MED ORDER — SUCCINYLCHOLINE CHLORIDE 200 MG/10ML IV SOSY
PREFILLED_SYRINGE | INTRAVENOUS | Status: DC | PRN
  Administered 2019-07-24: 180 mg via INTRAVENOUS

## 2019-07-24 MED ORDER — LIDOCAINE 2% (20 MG/ML) 5 ML SYRINGE
INTRAMUSCULAR | Status: DC | PRN
  Administered 2019-07-24: 100 mg via INTRAVENOUS

## 2019-07-24 MED ORDER — LACTATED RINGERS IV SOLN
INTRAVENOUS | Status: DC
  Administered 2019-07-24 (×2): via INTRAVENOUS
  Filled 2019-07-24: qty 1000

## 2019-07-24 MED ORDER — PROMETHAZINE HCL 25 MG/ML IJ SOLN
6.2500 mg | INTRAMUSCULAR | Status: DC | PRN
  Filled 2019-07-24: qty 1

## 2019-07-24 MED ORDER — CEFAZOLIN SODIUM-DEXTROSE 1-4 GM/50ML-% IV SOLN
INTRAVENOUS | Status: AC
  Filled 2019-07-24: qty 50

## 2019-07-24 MED ORDER — ACETAMINOPHEN 500 MG PO TABS
1000.0000 mg | ORAL_TABLET | ORAL | Status: AC
  Administered 2019-07-24: 1000 mg via ORAL
  Filled 2019-07-24: qty 2

## 2019-07-24 MED ORDER — KETOROLAC TROMETHAMINE 15 MG/ML IJ SOLN
15.0000 mg | INTRAMUSCULAR | Status: DC
  Filled 2019-07-24: qty 1

## 2019-07-24 MED ORDER — ROCURONIUM BROMIDE 10 MG/ML (PF) SYRINGE
PREFILLED_SYRINGE | INTRAVENOUS | Status: DC | PRN
  Administered 2019-07-24: 30 mg via INTRAVENOUS

## 2019-07-24 MED ORDER — FENTANYL CITRATE (PF) 100 MCG/2ML IJ SOLN
25.0000 ug | INTRAMUSCULAR | Status: DC | PRN
  Filled 2019-07-24: qty 1

## 2019-07-24 MED ORDER — ACETAMINOPHEN 500 MG PO TABS
ORAL_TABLET | ORAL | Status: AC
  Filled 2019-07-24: qty 2

## 2019-07-24 MED ORDER — IBUPROFEN 800 MG PO TABS: 800.0000 mg | ORAL_TABLET | Freq: Three times a day (TID) | ORAL | 0 refills | Status: DC | PRN

## 2019-07-24 MED ORDER — GABAPENTIN 300 MG PO CAPS
ORAL_CAPSULE | ORAL | Status: AC
  Filled 2019-07-24: qty 1

## 2019-07-24 MED ORDER — GABAPENTIN 300 MG PO CAPS
300.0000 mg | ORAL_CAPSULE | ORAL | Status: AC
  Administered 2019-07-24: 300 mg via ORAL
  Filled 2019-07-24: qty 1

## 2019-07-24 SURGICAL SUPPLY — 58 items
BLADE EXTENDED COATED 6.5IN (ELECTRODE) IMPLANT
BLADE HEX COATED 2.75 (ELECTRODE) ×3 IMPLANT
BLADE SURG 10 STRL SS (BLADE) ×2 IMPLANT
BLADE SURG 15 STRL LF DISP TIS (BLADE) ×1 IMPLANT
BLADE SURG 15 STRL SS (BLADE) ×3
BRIEF STRETCH FOR OB PAD LRG (UNDERPADS AND DIAPERS) ×2 IMPLANT
CANISTER SUCT 3000ML PPV (MISCELLANEOUS) ×3 IMPLANT
CHLORAPREP W/TINT 26ML (MISCELLANEOUS) ×3 IMPLANT
COVER BACK TABLE 60X90IN (DRAPES) ×3 IMPLANT
COVER MAYO STAND STRL (DRAPES) ×3 IMPLANT
COVER WAND RF STERILE (DRAPES) ×6 IMPLANT
DECANTER SPIKE VIAL GLASS SM (MISCELLANEOUS) ×3 IMPLANT
DRAIN PENROSE 18X1/4 LTX STRL (WOUND CARE) ×2 IMPLANT
DRAPE LAPAROTOMY 100X72 PEDS (DRAPES) ×3 IMPLANT
DRAPE SHEET LG 3/4 BI-LAMINATE (DRAPES) IMPLANT
DRAPE UTILITY XL STRL (DRAPES) ×3 IMPLANT
DRSG PAD ABDOMINAL 8X10 ST (GAUZE/BANDAGES/DRESSINGS) ×2 IMPLANT
ELECT BLADE TIP CTD 4 INCH (ELECTRODE) IMPLANT
ELECT REM PT RETURN 9FT ADLT (ELECTROSURGICAL) ×3
ELECTRODE REM PT RTRN 9FT ADLT (ELECTROSURGICAL) ×1 IMPLANT
GAUZE SPONGE 4X4 12PLY STRL (GAUZE/BANDAGES/DRESSINGS) ×3 IMPLANT
GAUZE SPONGE 4X4 12PLY STRL LF (GAUZE/BANDAGES/DRESSINGS) ×2 IMPLANT
GAUZE VASELINE 3X9 (GAUZE/BANDAGES/DRESSINGS) IMPLANT
GLOVE BIOGEL PI IND STRL 7.0 (GLOVE) ×1 IMPLANT
GLOVE BIOGEL PI INDICATOR 7.0 (GLOVE) ×6
GLOVE SURG SS PI 7.0 STRL IVOR (GLOVE) ×7 IMPLANT
GOWN STRL REUS W/TWL LRG LVL3 (GOWN DISPOSABLE) ×7 IMPLANT
KIT TURNOVER CYSTO (KITS) ×3 IMPLANT
NDL HYPO 25X1 1.5 SAFETY (NEEDLE) ×1 IMPLANT
NDL SAFETY ECLIPSE 18X1.5 (NEEDLE) IMPLANT
NEEDLE HYPO 18GX1.5 SHARP (NEEDLE)
NEEDLE HYPO 25X1 1.5 SAFETY (NEEDLE) ×3 IMPLANT
NS IRRIG 500ML POUR BTL (IV SOLUTION) ×3 IMPLANT
PACK BASIN DAY SURGERY FS (CUSTOM PROCEDURE TRAY) ×3 IMPLANT
PAD ABD 8X10 STRL (GAUZE/BANDAGES/DRESSINGS) IMPLANT
PAD ARMBOARD 7.5X6 YLW CONV (MISCELLANEOUS) ×4 IMPLANT
PENCIL BUTTON HOLSTER BLD 10FT (ELECTRODE) ×3 IMPLANT
SPONGE LAP 18X18 RF (DISPOSABLE) ×2 IMPLANT
SPONGE LAP 4X18 RFD (DISPOSABLE) IMPLANT
SPONGE SURGIFOAM ABS GEL 12-7 (HEMOSTASIS) IMPLANT
SUT CHROMIC 3 0 SH 27 (SUTURE) IMPLANT
SUT ETHILON 3 0 PS 1 (SUTURE) ×6 IMPLANT
SUT ETHILON 4 0 PS 2 18 (SUTURE) ×3 IMPLANT
SUT MNCRL AB 4-0 PS2 18 (SUTURE) IMPLANT
SUT VIC AB 2-0 SH 18 (SUTURE) ×3 IMPLANT
SUT VIC AB 2-0 SH 27 (SUTURE)
SUT VIC AB 2-0 SH 27XBRD (SUTURE) IMPLANT
SUT VIC AB 3-0 SH 27 (SUTURE) ×3
SUT VIC AB 3-0 SH 27X BRD (SUTURE) ×1 IMPLANT
SUT VIC AB 4-0 P-3 18XBRD (SUTURE) IMPLANT
SUT VIC AB 4-0 P3 18 (SUTURE)
SYR BULB IRRIGATION 50ML (SYRINGE) ×3 IMPLANT
SYR CONTROL 10ML LL (SYRINGE) ×3 IMPLANT
TOWEL OR 17X26 10 PK STRL BLUE (TOWEL DISPOSABLE) ×4 IMPLANT
TRAY DSU PREP LF (CUSTOM PROCEDURE TRAY) IMPLANT
TUBE CONNECTING 12'X1/4 (SUCTIONS) ×1
TUBE CONNECTING 12X1/4 (SUCTIONS) ×2 IMPLANT
YANKAUER SUCT BULB TIP NO VENT (SUCTIONS) ×3 IMPLANT

## 2019-07-24 NOTE — Transfer of Care (Signed)
Immediate Anesthesia Transfer of Care Note  Patient: Patricia Marks  Procedure(s) Performed: EXCISION OF PILONIDAL CYST (N/A Back)  Patient Location: PACU  Anesthesia Type:General  Level of Consciousness: awake, alert  and oriented  Airway & Oxygen Therapy: Patient Spontanous Breathing and Patient connected to face mask oxygen  Post-op Assessment: Report given to RN and Post -op Vital signs reviewed and stable  Post vital signs: Reviewed and stable  Last Vitals:  Vitals Value Taken Time  BP 160/85 07/24/19 1218  Temp    Pulse 79 07/24/19 1221  Resp 21 07/24/19 1221  SpO2 96 % 07/24/19 1221  Vitals shown include unvalidated device data.  Last Pain:  Vitals:   07/24/19 0951  TempSrc: Oral      Patients Stated Pain Goal: 5 (69/45/03 8882)  Complications: No apparent anesthesia complications

## 2019-07-24 NOTE — Anesthesia Procedure Notes (Signed)
Procedure Name: Intubation Date/Time: 07/24/2019 10:51 AM Performed by: Suan Halter, CRNA Pre-anesthesia Checklist: Patient identified, Emergency Drugs available, Suction available and Patient being monitored Patient Re-evaluated:Patient Re-evaluated prior to induction Oxygen Delivery Method: Circle system utilized Preoxygenation: Pre-oxygenation with 100% oxygen Induction Type: IV induction Ventilation: Mask ventilation without difficulty Laryngoscope Size: Mac and 3 Grade View: Grade I Tube type: Oral Tube size: 7.0 mm Number of attempts: 1 Airway Equipment and Method: Stylet and Oral airway Placement Confirmation: ETT inserted through vocal cords under direct vision,  positive ETCO2 and breath sounds checked- equal and bilateral Secured at: 22 cm Tube secured with: Tape Dental Injury: Teeth and Oropharynx as per pre-operative assessment

## 2019-07-24 NOTE — H&P (Signed)
Patricia Marks is an 43 y.o. female.   Chief Complaint: pilonidal cyst HPI: 43 yo female with infected pilonidal cyst that continues to drain serous fluid. She underwent drainage procedure and antibiotics and her pain is much improved but she has a persistent drainage from the area.  Past Medical History:  Diagnosis Date  . GERD (gastroesophageal reflux disease)   . Headache(784.0)    otc med prn  . History of anemia   . History of cardiac murmur as a child   . History of cervical dysplasia    2015  s/p  LEEP  . Hypertension   . Pilonidal sinus   . Seasonal allergies   . Wears contact lenses     Past Surgical History:  Procedure Laterality Date  . CERVICAL BIOPSY  W/ LOOP ELECTRODE EXCISION  03/31/2014   HGSIL with  . DILATATION & CURRETTAGE/HYSTEROSCOPY WITH RESECTOCOPE N/A 10/27/2013   Procedure: DILATATION & CURETTAGE/HYSTEROSCOPY;  Surgeon: Dara Lordsimothy P Fontaine, MD;  Location: [facility];  Service: Gynecology;  Laterality: N/A;  . DILATION AND CURETTAGE OF UTERUS  1998   MAB  . WISDOM TOOTH EXTRACTION      Family History  Problem Relation Age of Onset  . Diabetes Mother   . Hypertension Mother   . Thyroid disease Mother   . Hypertension Father   . Thyroid disease Father   . Thyroid disease Sister    Social History:  reports that she has never smoked. She has never used smokeless tobacco. She reports previous alcohol use. She reports that she does not use drugs.  Allergies: No Known Allergies  Medications Prior to Admission  Medication Sig Dispense Refill  . acetaminophen (TYLENOL) 500 MG tablet Take 500 mg by mouth every 6 (six) hours as needed.    Marland Kitchen. amLODipine (NORVASC) 10 MG tablet Take 10 mg by mouth daily.    Marland Kitchen. azelastine (OPTIVAR) 0.05 % ophthalmic solution Place 1 drop into both eyes as needed.    Marland Kitchen. ibuprofen (ADVIL) 200 MG tablet Take 200 mg by mouth every 6 (six) hours as needed.    Marland Kitchen. omeprazole (PRILOSEC) 20 MG capsule Take 20 mg by mouth as needed.       Results for orders placed or performed during the hospital encounter of 07/24/19 (from the past 48 hour(s))  Pregnancy, urine POC     Status: None   Collection Time: 07/24/19  9:26 AM  Result Value Ref Range   Preg Test, Ur NEGATIVE NEGATIVE    Comment:        THE SENSITIVITY OF THIS METHODOLOGY IS >24 mIU/mL    No results found.  Review of Systems  Constitutional: Negative for chills and fever.  HENT: Negative for hearing loss.   Eyes: Negative for blurred vision and double vision.  Respiratory: Negative for cough and hemoptysis.   Cardiovascular: Negative for chest pain and palpitations.  Gastrointestinal: Negative for abdominal pain, nausea and vomiting.  Genitourinary: Negative for dysuria and urgency.  Musculoskeletal: Negative for myalgias and neck pain.  Skin: Negative for itching and rash.  Neurological: Negative for dizziness, tingling and headaches.  Endo/Heme/Allergies: Does not bruise/bleed easily.  Psychiatric/Behavioral: Negative for depression and suicidal ideas.    Pulse 99, temperature 98.3 F (36.8 C), temperature source Oral, resp. rate 16, height 5\' 6"  (1.676 m), weight (!) 143.9 kg, last menstrual period 11/22/2018, SpO2 99 %. Physical Exam  Vitals reviewed. Constitutional: She is oriented to person, place, and time. She appears well-developed and well-nourished.  HENT:  Head: Normocephalic and atraumatic.  Eyes: Pupils are equal, round, and reactive to light. Conjunctivae and EOM are normal.  Neck: Normal range of motion. Neck supple.  Cardiovascular: Normal rate and regular rhythm.  Respiratory: Effort normal and breath sounds normal.  GI: Soft. Bowel sounds are normal. She exhibits no distension. There is no abdominal tenderness.  Musculoskeletal: Normal range of motion.  Neurological: She is alert and oriented to person, place, and time.  Skin: Skin is warm and dry.  Pilonidal cyst with small opening, no erythema  Psychiatric: She has a normal  mood and affect. Her behavior is normal.     Assessment/Plan 43 yo female with obesity and pilonidal cyst disease -excision of cyst -planned outpatient procedure  Mickeal Skinner, MD 07/24/2019, 10:18 AM

## 2019-07-24 NOTE — Op Note (Signed)
Preoperative diagnosis: pilonidal cyst  Postoperative diagnosis: same   Procedure: excision of pilonidal cyst   Surgeon: Gurney Maxin, M.D.  Asst: Jeralyn Bennett  Anesthesia: general  Indications for procedure: Patricia Marks is a 43 y.o. year old female with symptoms of pilonidal abscess s/p I+D that had persistent drainage from a wound opening.  Description of procedure: The patient was brought into the operative suite. Anesthesia was administered with General endotracheal anesthesia. WHO checklist was applied. The patient was then placed in prone position. The area was prepped and draped in the usual sterile fashion.  Next, an elliptical incision was made around the opening trying to stay to the left of midline. Cautery was used to dissect around the scar tissue and cyst down to the level of the sacrum. The cyst was removed from the sacrum in its entirety. There was a small aspect of the cyst on the left superior aspect near the skin that appeared to be the main area of drainage.   Hemostasis was applied with cautery. A penrose was placed to the base of the wound and brought out through a separate incision on the left medial gluteal area. The deep fascial layer was incised to allow apposition of deep tissues. 2-0 vicryl was used to appose multiple layers of the deep layer and subcutaneous tissue in interrupted technique. The deep dermal/subcutaneous interface was incised to allow increased mobility of the skin. The deep dermal layer was apposed with 2-0 vicryl in interrupted technique. The skin was closed with 3-0 nylon vertical mattress sutures. Dressing was put in place. The patient was placed in supine position and awoke from anesthesia. All counts were correct.  Findings: pilonidal cyst  Specimen: pilonidal cyst  Implant: 1/4" penrose to deep space of wound   Blood loss: 10ml  Local anesthesia: 50 ml Marcaine/Exparel mix  Complications: none  Gurney Maxin,  M.D. General, Bariatric, & Minimally Invasive Surgery [facility]

## 2019-07-24 NOTE — Discharge Instructions (Signed)
Information for Discharge Teaching: EXPAREL (bupivacaine liposome injectable suspension)   Your surgeon gave you EXPAREL(bupivacaine) in your surgical incision to help control your pain after surgery.   EXPAREL is a local anesthetic that provides pain relief by numbing the tissue around the surgical site.  EXPAREL is designed to release pain medication over time and can control pain for up to 72 hours.  Depending on how you respond to EXPAREL, you may require less pain medication during your recovery.  Possible side effects:  Temporary loss of sensation or ability to move in the area where bupivacaine was injected.  Nausea, vomiting, constipation  Rarely, numbness and tingling in your mouth or lips, lightheadedness, or anxiety may occur.  Call your doctor right away if you think you may be experiencing any of these sensations, or if you have other questions regarding possible side effects.  Follow all other discharge instructions given to you by your surgeon or nurse. Eat a healthy diet and drink plenty of water or other fluids.  If you return to the hospital for any reason within 96 hours following the administration of EXPAREL, please inform your health care providers. Post Anesthesia Home Care Instructions  Activity: Get plenty of rest for the remainder of the day. A responsible adult should stay with you for 24 hours following the procedure.  For the next 24 hours, DO NOT: -Drive a car -Advertising copywriter -Drink alcoholic beverages -Take any medication unless instructed by your physician -Make any legal decisions or sign important papers.  Meals: Start with liquid foods such as gelatin or soup. Progress to regular foods as tolerated. Avoid greasy, spicy, heavy foods. If nausea and/or vomiting occur, drink only clear liquids until the nausea and/or vomiting subsides. Call your physician if vomiting continues.  Special Instructions/Symptoms: Your throat may feel dry or sore  from the anesthesia or the breathing tube placed in your throat during surgery. If this causes discomfort, gargle with warm salt water. The discomfort should disappear within 24 hours.  If you had a scopolamine patch placed behind your ear for the management of post- operative nausea and/or vomiting:  1. The medication in the patch is effective for 72 hours, after which it should be removed.  Wrap patch in a tissue and discard in the trash. Wash hands thoroughly with soap and water. 2. You may remove the patch earlier than 72 hours if you experience unpleasant side effects which may include dry mouth, dizziness or visual disturbances. 3. Avoid touching the patch. Wash your hands with soap and water after contact with the patch.    Pilonidal Cyst Drainage, Care After This sheet gives you information about how to care for yourself after your procedure. Your health care provider may also give you more specific instructions. If you have problems or questions, contact your health care provider. What can I expect after the procedure? After the procedure, it is common to have:  Pain that gets better when you take medicine.  Some fluid or blood coming from your wound. Follow these instructions at home: Medicines  Take over-the-counter and prescription medicines only as told by your health care provider.  If you were prescribed an antibiotic medicine, take it as told by your health care provider. Do not stop taking the antibiotic even if you start to feel better. Lifestyle  Do not do activities that irritate or put pressure on your buttocks for about 2 weeks, or as long as told by your health care provider. These activities include bike  riding, running, and anything that involves a twisting motion.  Do not sit for long periods at a time without getting up to move around.  Sleep on your side instead of your back.  Avoid wearing tight underwear and tight pants. Bathing  Do not take baths or  showers, swim, or use a hot tub until your health care provider approves. This depends on the type of wound you have from surgery.  While bathing, clean your buttocks area gently with soap and water.  After bathing: ? Pat the area dry with a soft, clean towel. ? Cover the area with a clean bandage (dressing), if told to by your health care provider. General instructions   If you are taking prescription pain medicine, take actions to prevent or treat constipation. Your health care provider may recommend that you: ? Drink enough fluid to keep your urine pale yellow. ? Eat foods that are high in fiber, such as fresh fruits and vegetables, whole grains, and beans. ? Limit foods that are high in fat and processed sugars, such as fried or sweet foods. ? Take an over-the-counter or prescription medicine for constipation.  You will need to have a caregiver help you manage wound care and dressing changes. Your caregiver should: ? Wash his or her hands with soap and water before changing your dressing. If soap and water are not available, your caregiver should use hand sanitizer. ? Check your wound every day for signs of infection, such as:  Redness, swelling, or more pain.  More fluid or blood.  Warmth.  Pus or a bad smell. ? Follow any additional instructions from your health care provider on how to care for your wound, such as wound cleaning, wound flushing (irrigation), or packing your wound with a dressing.  Keep all follow-up visits as told by your health care provider. This is important. If you had incision and drainage with wound packing:  Return to your health care provider as instructed to have your packing material changed or removed.  Keep the area dry until your packing has been removed.  After the packing has been removed, you may start taking showers. If you had marsupialization:  You may start taking showers the day after surgery, or when your health care provider  approves.  Remove your dressing before you shower, but let the water from the shower moisten your dressing before you remove it. This will make it easier to remove.  Ask your health care provider when you can stop using a dressing. If you had incision and drainage without wound packing:  Change your dressing as directed.  Leave stitches (sutures), skin glue, or adhesive strips in place. These skin closures may need to stay in place for 2 weeks or longer. If adhesive strip edges start to loosen and curl up, you may trim the loose edges. Do not remove adhesive strips completely unless your health care provider tells you to do that. Contact a health care provider if:  You have redness, swelling, or more pain around your wound.  You have more fluid or blood coming from your wound.  You have new bleeding from your wound.  Your wound feels warm to the touch.  There is pus or a bad smell coming from your wound.  You have pain that does not get better with medicine.  You have a fever or chills.  You have muscle aches.  You are dizzy.  You feel generally sick. Summary  After a procedure to drain a pilonidal  cyst, it is common to have some fluid or blood coming from your wound.  If you were prescribed an antibiotic medicine, take it as told by your health care provider. Do not stop taking the antibiotic even if you start to feel better.  Return to your health care provider as instructed to have any packing material changed or removed. This information is not intended to replace advice given to you by your health care provider. Make sure you discuss any questions you have with your health care provider. Document Released: 11/23/2006 Document Revised: 08/15/2018 Document Reviewed: 10/15/2017 Elsevier Patient Education  2020 [employer].

## 2019-07-24 NOTE — Anesthesia Preprocedure Evaluation (Signed)
Anesthesia Evaluation  Patient identified by MRN, date of birth, ID band Patient awake    Reviewed: Allergy & Precautions, NPO status , Patient's Chart, lab work & pertinent test results  Airway Mallampati: II  TM Distance: >3 FB Neck ROM: Full    Dental no notable dental hx.    Pulmonary neg pulmonary ROS,    Pulmonary exam normal breath sounds clear to auscultation       Cardiovascular hypertension, Pt. on medications Normal cardiovascular exam Rhythm:Regular Rate:Normal     Neuro/Psych negative neurological ROS  negative psych ROS   GI/Hepatic Neg liver ROS, GERD  Medicated,  Endo/Other  Morbid obesity  Renal/GU negative Renal ROS  negative genitourinary   Musculoskeletal negative musculoskeletal ROS (+)   Abdominal (+) + obese,   Peds negative pediatric ROS (+)  Hematology negative hematology ROS (+)   Anesthesia Other Findings   Reproductive/Obstetrics negative OB ROS                             Anesthesia Physical Anesthesia Plan  ASA: III  Anesthesia Plan: General   Post-op Pain Management:    Induction: Intravenous and Rapid sequence  PONV Risk Score and Plan: 3 and Ondansetron, Dexamethasone and Treatment may vary due to age or medical condition  Airway Management Planned: Oral ETT  Additional Equipment:   Intra-op Plan:   Post-operative Plan: Extubation in OR  Informed Consent: I have reviewed the patients History and Physical, chart, labs and discussed the procedure including the risks, benefits and alternatives for the proposed anesthesia with the patient or authorized representative who has indicated his/her understanding and acceptance.     Dental advisory given  Plan Discussed with: CRNA and Surgeon  Anesthesia Plan Comments:         Anesthesia Quick Evaluation

## 2019-07-24 NOTE — Anesthesia Postprocedure Evaluation (Signed)
Anesthesia Post Note  Patient: Patricia Marks  Procedure(s) Performed: EXCISION OF PILONIDAL CYST (N/A Back)     Patient location during evaluation: PACU Anesthesia Type: General Level of consciousness: awake and alert Pain management: pain level controlled Vital Signs Assessment: post-procedure vital signs reviewed and stable Respiratory status: spontaneous breathing, nonlabored ventilation, respiratory function stable and patient connected to nasal cannula oxygen Cardiovascular status: blood pressure returned to baseline and stable Postop Assessment: no apparent nausea or vomiting Anesthetic complications: no    Last Vitals:  Vitals:   07/24/19 1218 07/24/19 1230  BP: (!) 160/85 (!) 158/100  Pulse: 84 89  Resp: (!) 27 (!) 22  Temp: 36.7 C   SpO2: 96% 95%    Last Pain:  Vitals:   07/24/19 1230  TempSrc:   PainSc: Asleep                 Shakenya Stoneberg S

## 2019-07-25 ENCOUNTER — Encounter (HOSPITAL_BASED_OUTPATIENT_CLINIC_OR_DEPARTMENT_OTHER): Payer: Self-pay | Admitting: General Surgery

## 2019-07-28 LAB — SURGICAL PATHOLOGY

## 2019-07-29 ENCOUNTER — Encounter: Payer: Self-pay | Admitting: Gynecology

## 2019-09-25 ENCOUNTER — Ambulatory Visit (HOSPITAL_COMMUNITY)
Admission: EM | Admit: 2019-09-25 | Discharge: 2019-09-25 | Disposition: A | Discharge status: Home / Self Care | Payer: BC Managed Care – PPO

## 2019-09-25 ENCOUNTER — Other Ambulatory Visit: Payer: Self-pay

## 2019-09-25 DIAGNOSIS — Z20822 Contact with and (suspected) exposure to covid-19: Secondary | ICD-10-CM

## 2019-09-28 LAB — NOVEL CORONAVIRUS, NAA: SARS-CoV-2, NAA: DETECTED — AB

## 2019-09-30 ENCOUNTER — Other Ambulatory Visit: Payer: Self-pay | Admitting: Critical Care Medicine

## 2019-09-30 ENCOUNTER — Ambulatory Visit (HOSPITAL_COMMUNITY)
Admission: RE | Admit: 2019-09-30 | Discharge: 2019-09-30 | Disposition: A | Discharge status: Home / Self Care | Payer: BC Managed Care – PPO | Source: Ambulatory Visit | Attending: Critical Care Medicine | Admitting: Critical Care Medicine

## 2019-09-30 DIAGNOSIS — I10 Essential (primary) hypertension: Secondary | ICD-10-CM

## 2019-09-30 DIAGNOSIS — E669 Obesity, unspecified: Secondary | ICD-10-CM | POA: Insufficient documentation

## 2019-09-30 DIAGNOSIS — U071 COVID-19: Secondary | ICD-10-CM

## 2019-09-30 MED ORDER — FAMOTIDINE IN NACL 20-0.9 MG/50ML-% IV SOLN: 20.0000 mg | Freq: Once | INTRAVENOUS | Status: DC | PRN

## 2019-09-30 MED ORDER — DIPHENHYDRAMINE HCL 50 MG/ML IJ SOLN: 50.0000 mg | Freq: Once | INTRAMUSCULAR | Status: DC | PRN

## 2019-09-30 MED ORDER — METHYLPREDNISOLONE SODIUM SUCC 125 MG IJ SOLR: 125.0000 mg | Freq: Once | INTRAMUSCULAR | Status: DC | PRN

## 2019-09-30 MED ORDER — EPINEPHRINE 0.3 MG/0.3ML IJ SOAJ: 0.3000 mg | Freq: Once | INTRAMUSCULAR | Status: DC | PRN

## 2019-09-30 MED ORDER — SODIUM CHLORIDE 0.9 % IV SOLN
700.0000 mg | Freq: Once | INTRAVENOUS | Status: AC
  Administered 2019-09-30: 14:00:00 700 mg via INTRAVENOUS
  Filled 2019-09-30: qty 20

## 2019-09-30 MED ORDER — ALBUTEROL SULFATE (2.5 MG/3ML) 0.083% IN NEBU: 2.5000 mg | INHALATION_SOLUTION | Freq: Once | RESPIRATORY_TRACT | Status: DC | PRN

## 2019-09-30 MED ORDER — SODIUM CHLORIDE 0.9 % IV SOLN
INTRAVENOUS | Status: DC | PRN
  Administered 2019-09-30: 1 mL via INTRAVENOUS

## 2019-09-30 NOTE — Progress Notes (Signed)
  Diagnosis: COVID-19  Physician:  Procedure: bamlanivimab infusion Provided patient with bamlanivimab fact sheet for patients, parents and caregivers prior to infusion.  Complications: No immediate complications noted.  Discharge: Discharged home   Patricia Marks 09/30/2019

## 2019-09-30 NOTE — Progress Notes (Signed)
  I connected by phone with Patricia Marks on 09/30/2019 at 11:49 AM to discuss the potential use of an new treatment for mild to moderate COVID-19 viral infection in non-hospitalized patients.  This patient is a 43 y.o. female that meets the FDA criteria for Emergency Use Authorization of bamlanivimab:  Has a (+) direct SARS-CoV-2 viral test result  Has mild or moderate COVID-19   Is ? 44 years of age and weighs ? 40 kg  Is NOT hospitalized due to COVID-19  Is NOT requiring oxygen therapy or requiring an increase in baseline oxygen flow rate due to COVID-19  Is within 10 days of symptom onset  Has at least one of the high risk factor(s) for progression to severe COVID-19 and/or hospitalization as defined in EUA.  Specific high risk criteria : Hypertension, BMI >/= 35  Patient Active Problem List   Diagnosis Date Noted  . HTN (hypertension) 09/30/2019  . Obesity (BMI 35.0-39.9 without comorbidity) 09/30/2019  . COVID-19 virus infection 09/30/2019    I have spoken and communicated the following to the patient or parent/caregiver:  1. FDA has authorized the emergency use of bamlanivimab for the treatment of mild to moderate COVID-19 in adults and pediatric patients with positive results of direct SARS-CoV-2 viral testing who are 50 years of age and older weighing at least 40 kg, and who are at high risk for progressing to severe COVID-19 and/or hospitalization.  2. The significant known and potential risks and benefits of bamlanivimab, and the extent to which such potential risks and benefits are unknown.  3. Information on available alternative treatments and the risks and benefits of those alternatives, including clinical trials.  4. Patients treated with bamlanivimab should continue to self-isolate and use infection control measures (e.g., wear mask, isolate, social distance, avoid sharing personal items, clean and disinfect "high touch" surfaces, and frequent handwashing)  according to CDC guidelines.   5. The patient or parent/caregiver has the option to accept or refuse bamlanivimab.  After reviewing this information with the patient, The patient agreed to proceed with receiving the infusion of bamlanivimab and will be provided a copy of the Fact sheet prior to receiving the infusion.  [name] 09/30/2019 11:49 AM

## 2019-10-06 ENCOUNTER — Other Ambulatory Visit: Payer: Self-pay

## 2019-10-06 DIAGNOSIS — Z20822 Contact with and (suspected) exposure to covid-19: Secondary | ICD-10-CM

## 2019-10-07 LAB — NOVEL CORONAVIRUS, NAA: SARS-CoV-2, NAA: NOT DETECTED

## 2019-10-07 NOTE — Progress Notes (Signed)
Based on my clinical judgment hypertension and obesity were present in this patient when I made my entry November 24 in the progress note.  This was for assessing the patient to be treated for Covid infection with the monoclonal antibody

## 2022-01-13 ENCOUNTER — Other Ambulatory Visit: Payer: Self-pay | Admitting: Gastroenterology

## 2022-03-28 ENCOUNTER — Encounter (HOSPITAL_COMMUNITY): Payer: Self-pay | Admitting: Gastroenterology

## 2022-04-10 NOTE — H&P (Signed)
  History of Present Illness  General:  46 year old female presenting for consultation of colon cancer screening. Patient denies any complaints. Patient denies dysphagia, dyspepsia, nausea, vomiting. Patient denies change in bowel habits, constipation, diarrhea, hematochezia, changes in appetite, unintentional weight loss, melena. Patient denies ASA or blood thinner use. Denies family history of colon cancer or other gastrointestinal malignancies. patient states her mother and sister both have polyps.   Current Medications  Taking   amLODIPine Besylate 10 MG Tablet 1 tablet Orally Once a day  Telmisartan 20 MG Tablet 1 tablet Orally Once a day  Wegovy(Semaglutide-Weight Management) 0.5 MG/0.5ML Solution Auto-injector 0.5 mL Subcutaneous   Vitamin D3 250 MCG (10000 UT) Capsule as directed Orally   Medication List reviewed and reconciled with the patient   Past Medical History  Medical History Verified.    Surgical History  No Surgical History documented.   Family History  Mother: alive, polyps  Sister 1: alive, polyps  No family history of colon cancer liver disease.   Social History  General:  Tobacco use cigarettes: Never smoked, Tobacco history last updated 01/13/2022. Alcohol: occasionally. no Recreational drug use.    Allergies  N.K.D.A.   Hospitalization/Major Diagnostic Procedure  none over the past year 01/2022   Review of Systems  GI PROCEDURE:  Pacemaker/ AICD no. Artificial heart valves no. MI/heart attack no. Abnormal heart rhythm no. Angina no. CVA no. Hypertension YES. Hypotension no. Asthma, COPD no. Sleep apnea no. Seizure disorders no. Artificial joints no. Diabetes no. Significant headaches no. Vertigo no. Depression/anxiety no. Abnormal bleeding no. Kidney Disease no. Liver disease no. Blood transfusion no.     Vital Signs  Wt 312.6, Ht 66, BMI 50.45, Temp 98.1, Pulse sitting 98, BP sitting 134/83.   Examination  Gastroenterology Exam: GENERAL  APPEARANCE: morbidly obese, no active distress, pleasant, no acute distress . EYES: Lids and conjunctiva normal. Sclera normal, pupils equal and reactive. SCLERA: anicteric. RESPIRATORY Breath sounds normal. Respiration even and unlabored. CARDIOVASCULAR Normal RRR w/o murmers or gallops. No peripheral edema. SKIN Warm and dry. PSYCHIATRIC Alert and oriented x3, mood and affect appear normal..     Assessments     1. Screening for colon cancer - Z12.11 (Primary)   Attendance to Dr. Dulce Sellar in the office, available for questions.   Treatment  1. Screening for colon cancer  IMAGING: Colonoscopy       Clinical Notes: Patient is due for inital screening colonoscopy. I thoroughly discussed the procedure with the patient including but not limited to nature, alternatives, benefits, and risks (including but not limited to bleeding, infection, perforation, anesthesia/cardiopulmonary complications). All questions were answered and the patient acknowledges these risks and wishes to proceed with colonoscopy  procedure will have to be done at the hospital due to BMI > 50.

## 2022-04-10 NOTE — Anesthesia Preprocedure Evaluation (Signed)
Anesthesia Evaluation  Patient identified by MRN, date of birth, ID band Patient awake    Reviewed: Allergy & Precautions, NPO status , Patient's Chart, lab work & pertinent test results  Airway Mallampati: II  TM Distance: >3 FB Neck ROM: Full    Dental no notable dental hx. (+) Teeth Intact, Dental Advisory Given   Pulmonary neg pulmonary ROS,    Pulmonary exam normal breath sounds clear to auscultation       Cardiovascular hypertension, Normal cardiovascular exam Rhythm:Regular Rate:Normal     Neuro/Psych  Headaches,    GI/Hepatic GERD  ,  Endo/Other  Morbid obesity (BMI 50.45)  Renal/GU negative Renal ROS     Musculoskeletal   Abdominal   Peds  Hematology   Anesthesia Other Findings   Reproductive/Obstetrics                           Anesthesia Physical Anesthesia Plan  ASA: 3  Anesthesia Plan: MAC   Post-op Pain Management:    Induction:   PONV Risk Score and Plan: Treatment may vary due to age or medical condition  Airway Management Planned: Natural Airway and Nasal Cannula  Additional Equipment: None  Intra-op Plan:   Post-operative Plan:   Informed Consent: I have reviewed the patients History and Physical, chart, labs and discussed the procedure including the risks, benefits and alternatives for the proposed anesthesia with the patient or authorized representative who has indicated his/her understanding and acceptance.     Dental advisory given  Plan Discussed with:   Anesthesia Plan Comments: (Screening colonoscopy)       Anesthesia Quick Evaluation

## 2022-04-11 ENCOUNTER — Ambulatory Visit (HOSPITAL_COMMUNITY): Payer: BC Managed Care – PPO | Admitting: Certified Registered Nurse Anesthetist

## 2022-04-11 ENCOUNTER — Other Ambulatory Visit: Payer: Self-pay

## 2022-04-11 ENCOUNTER — Encounter (HOSPITAL_COMMUNITY): Payer: Self-pay | Admitting: Gastroenterology

## 2022-04-11 ENCOUNTER — Encounter (HOSPITAL_COMMUNITY)
Admission: RE | Disposition: A | Discharge status: Home / Self Care | Payer: Self-pay | Source: Home / Self Care | Attending: Gastroenterology

## 2022-04-11 ENCOUNTER — Ambulatory Visit (HOSPITAL_COMMUNITY)
Admission: RE | Admit: 2022-04-11 | Discharge: 2022-04-11 | Disposition: A | Discharge status: Home / Self Care | Payer: BC Managed Care – PPO | Attending: Gastroenterology | Admitting: Gastroenterology

## 2022-04-11 DIAGNOSIS — Z6841 Body Mass Index (BMI) 40.0 and over, adult: Secondary | ICD-10-CM | POA: Insufficient documentation

## 2022-04-11 DIAGNOSIS — K635 Polyp of colon: Secondary | ICD-10-CM | POA: Diagnosis not present

## 2022-04-11 DIAGNOSIS — Z1211 Encounter for screening for malignant neoplasm of colon: Secondary | ICD-10-CM | POA: Diagnosis not present

## 2022-04-11 DIAGNOSIS — I10 Essential (primary) hypertension: Secondary | ICD-10-CM | POA: Insufficient documentation

## 2022-04-11 DIAGNOSIS — K648 Other hemorrhoids: Secondary | ICD-10-CM | POA: Diagnosis not present

## 2022-04-11 DIAGNOSIS — K5730 Diverticulosis of large intestine without perforation or abscess without bleeding: Secondary | ICD-10-CM | POA: Diagnosis not present

## 2022-04-11 DIAGNOSIS — K219 Gastro-esophageal reflux disease without esophagitis: Secondary | ICD-10-CM | POA: Insufficient documentation

## 2022-04-11 HISTORY — PX: COLONOSCOPY WITH PROPOFOL: SHX5780

## 2022-04-11 HISTORY — PX: POLYPECTOMY: SHX5525

## 2022-04-11 SURGERY — COLONOSCOPY WITH PROPOFOL
Anesthesia: Monitor Anesthesia Care

## 2022-04-11 MED ORDER — PROPOFOL 10 MG/ML IV BOLUS
INTRAVENOUS | Status: DC | PRN
  Administered 2022-04-11 (×2): 20 mg via INTRAVENOUS

## 2022-04-11 MED ORDER — PROPOFOL 10 MG/ML IV BOLUS
INTRAVENOUS | Status: AC
  Filled 2022-04-11: qty 20

## 2022-04-11 MED ORDER — LACTATED RINGERS IV SOLN
INTRAVENOUS | Status: DC
  Administered 2022-04-11: 1000 mL via INTRAVENOUS

## 2022-04-11 MED ORDER — PROPOFOL 500 MG/50ML IV EMUL
INTRAVENOUS | Status: DC | PRN
  Administered 2022-04-11: 125 ug/kg/min via INTRAVENOUS

## 2022-04-11 SURGICAL SUPPLY — 22 items

## 2022-04-11 NOTE — Anesthesia Procedure Notes (Addendum)
Procedure Name: MAC Date/Time: 04/11/2022 8:11 AM Performed by: West Pugh, CRNA Pre-anesthesia Checklist: Patient identified, Emergency Drugs available, Suction available, Patient being monitored and Timeout performed Patient Re-evaluated:Patient Re-evaluated prior to induction Oxygen Delivery Method: Simple face mask Preoxygenation: Pre-oxygenation with 100% oxygen Induction Type: IV induction Placement Confirmation: positive ETCO2

## 2022-04-11 NOTE — Transfer of Care (Signed)
Immediate Anesthesia Transfer of Care Note  Patient: Patricia Marks  Procedure(s) Performed: COLONOSCOPY WITH PROPOFOL  Patient Location: PACU and Endoscopy Unit  Anesthesia Type:MAC  Level of Consciousness: drowsy  Airway & Oxygen Therapy: Patient Spontanous Breathing and Patient connected to face mask oxygen  Post-op Assessment: Report given to RN and Post -op Vital signs reviewed and stable  Post vital signs: Reviewed and stable  Last Vitals:  Vitals Value Taken Time  BP 146/111 04/11/22 0843  Temp 36.6 C 04/11/22 0843  Pulse 102 04/11/22 0843  Resp 17 04/11/22 0843  SpO2 98 % 04/11/22 0843  Vitals shown include unvalidated device data.  Last Pain:  Vitals:   04/11/22 0843  TempSrc: Temporal  PainSc: 0-No pain         Complications: No notable events documented.

## 2022-04-11 NOTE — Discharge Instructions (Signed)

## 2022-04-11 NOTE — Op Note (Signed)
[facility] Patient Name: Patricia Marks Procedure Date: 04/11/2022 MRN: [id number] Attending MD: Kerin Salen , MD Date of Birth: 09-Aug-1976 CSN: [id number] Age: 46 Admit Type: Outpatient Procedure:                Colonoscopy Indications:              Screening for colorectal malignant neoplasm, This                            is the patient's first colonoscopy Providers:                Kerin Salen, MD, Marge Duncans, RN, Rozetta Nunnery, Technician Referring MD:             Haynes Dage Medicines:                Monitored Anesthesia Care Complications:            No immediate complications. Estimated blood loss:                            Minimal. Estimated Blood Loss:     Estimated blood loss was minimal. Procedure:                Pre-Anesthesia Assessment:                           - Prior to the procedure, a History and Physical                            was performed, and patient medications and                            allergies were reviewed. The patient's tolerance of                            previous anesthesia was also reviewed. The risks                            and benefits of the procedure and the sedation                            options and risks were discussed with the patient.                            All questions were answered, and informed consent                            was obtained. Prior Anticoagulants: The patient has                            taken no previous anticoagulant or antiplatelet                            agents. ASA Grade Assessment:  III - A patient with                            severe systemic disease. After reviewing the risks                            and benefits, the patient was deemed in                            satisfactory condition to undergo the procedure.                           After obtaining informed consent, the colonoscope                            was passed  under direct vision. Throughout the                            procedure, the patient's blood pressure, pulse, and                            oxygen saturations were monitored continuously. The                            [id number]-[id number] ([id number] Olympus colonoscope was                            introduced through the anus and advanced to the the                            terminal ileum. The colonoscopy was performed                            without difficulty. The patient tolerated the                            procedure well. The quality of the bowel                            preparation was good. Scope In: 8:19:02 AM Scope Out: 8:36:57 AM Scope Withdrawal Time: 0 hours 12 minutes 34 seconds  Total Procedure Duration: 0 hours 17 minutes 55 seconds  Findings:      The perianal and digital rectal examinations were normal.      The terminal ileum appeared normal.      A few sessile polyps were found in the sigmoid colon. The polyps were 3       to 6 mm in size. These polyps were removed with a piecemeal technique       using a cold biopsy forceps. Resection and retrieval were complete.      A few small-mouthed diverticula were found in the sigmoid colon and       descending colon.      Non-bleeding internal hemorrhoids were found during retroflexion. The       hemorrhoids were small. Impression:               -  The examined portion of the ileum was normal.                           - A few 3 to 6 mm polyps in the sigmoid colon,                            removed piecemeal using a cold biopsy forceps.                            Resected and retrieved.                           - Diverticulosis in the sigmoid colon and in the                            descending colon.                           - Non-bleeding internal hemorrhoids. Moderate Sedation:      Patient did not receive moderate sedation for this procedure, but       instead received monitored anesthesia care. Recommendation:            - Patient has a contact number available for                            emergencies. The signs and symptoms of potential                            delayed complications were discussed with the                            patient. Return to normal activities tomorrow.                            Written discharge instructions were provided to the                            patient.                           - High fiber diet.                           - Continue present medications.                           - Await pathology results.                           - Repeat colonoscopy for surveillance based on                            pathology results. Procedure Code(s):        --- Professional ---  73710, Colonoscopy, flexible; with biopsy, single                            or multiple Diagnosis Code(s):        --- Professional ---                           Z12.11, Encounter for screening for malignant                            neoplasm of colon                           K64.8, Other hemorrhoids                           K63.5, Polyp of colon                           K57.30, Diverticulosis of large intestine without                            perforation or abscess without bleeding CPT copyright 2019 [employer]. All rights reserved. The codes documented in this report are preliminary and upon coder review may  be revised to meet current compliance requirements. Kerin Salen, MD 04/11/2022 8:41:04 AM This report has been signed electronically. Number of Addenda: 0

## 2022-04-11 NOTE — Anesthesia Postprocedure Evaluation (Signed)
Anesthesia Post Note  Patient: Patricia Marks  Procedure(s) Performed: COLONOSCOPY WITH PROPOFOL POLYPECTOMY     Patient location during evaluation: Endoscopy Anesthesia Type: MAC Level of consciousness: awake and alert Pain management: pain level controlled Vital Signs Assessment: post-procedure vital signs reviewed and stable Respiratory status: spontaneous breathing, nonlabored ventilation, respiratory function stable and patient connected to nasal cannula oxygen Cardiovascular status: stable and blood pressure returned to baseline Postop Assessment: no apparent nausea or vomiting Anesthetic complications: no   No notable events documented.  Last Vitals:  Vitals:   04/11/22 0852 04/11/22 0902  BP: 110/69 (!) 155/78  Pulse: 93 76  Resp: (!) 23 13  Temp:    SpO2: 98% 99%    Last Pain:  Vitals:   04/11/22 0902  TempSrc:   PainSc: 0-No pain                 Barnet Glasgow

## 2022-04-12 ENCOUNTER — Encounter (HOSPITAL_COMMUNITY): Payer: Self-pay | Admitting: Gastroenterology

## 2022-04-12 LAB — SURGICAL PATHOLOGY

## 2024-05-23 ENCOUNTER — Encounter: Payer: Self-pay | Admitting: Advanced Practice Midwife
